# Patient Record
Sex: Male | Born: 1980 | Race: White | Hispanic: Yes | Marital: Married | Smoking: Former smoker
Health system: Southern US, Community
[De-identification: ages and names within clinical notes are randomized; demographics above are authoritative.]

## PROBLEM LIST (undated history)

## (undated) DIAGNOSIS — K5792 Diverticulitis of intestine, part unspecified, without perforation or abscess without bleeding: Secondary | ICD-10-CM

## (undated) DIAGNOSIS — T7840XA Allergy, unspecified, initial encounter: Secondary | ICD-10-CM

## (undated) HISTORY — DX: Allergy, unspecified, initial encounter: T78.40XA

---

## 2004-10-09 ENCOUNTER — Ambulatory Visit: Payer: Self-pay | Admitting: Family Medicine

## 2005-04-11 ENCOUNTER — Emergency Department (HOSPITAL_COMMUNITY): Admission: EM | Admit: 2005-04-11 | Discharge: 2005-04-12 | Payer: Self-pay | Admitting: Emergency Medicine

## 2006-04-08 ENCOUNTER — Emergency Department (HOSPITAL_COMMUNITY): Admission: EM | Admit: 2006-04-08 | Discharge: 2006-04-09 | Payer: Self-pay | Admitting: Emergency Medicine

## 2006-04-26 ENCOUNTER — Ambulatory Visit: Payer: Self-pay | Admitting: Gastroenterology

## 2006-05-01 ENCOUNTER — Ambulatory Visit: Payer: Self-pay | Admitting: Gastroenterology

## 2006-06-11 ENCOUNTER — Ambulatory Visit: Payer: Self-pay | Admitting: Gastroenterology

## 2008-05-13 ENCOUNTER — Ambulatory Visit: Payer: Self-pay | Admitting: Internal Medicine

## 2008-05-13 DIAGNOSIS — K219 Gastro-esophageal reflux disease without esophagitis: Secondary | ICD-10-CM | POA: Insufficient documentation

## 2008-05-13 DIAGNOSIS — K5900 Constipation, unspecified: Secondary | ICD-10-CM | POA: Insufficient documentation

## 2008-06-18 ENCOUNTER — Ambulatory Visit: Payer: Self-pay | Admitting: Internal Medicine

## 2008-06-18 LAB — CONVERTED CEMR LAB
ALT: 20 units/L (ref 0–53)
AST: 21 units/L (ref 0–37)
Albumin: 4.9 g/dL (ref 3.5–5.2)
BUN: 11 mg/dL (ref 6–23)
Basophils Absolute: 0 10*3/uL (ref 0.0–0.1)
Basophils Relative: 1 % (ref 0–1)
Bilirubin Urine: NEGATIVE
Blood in Urine, dipstick: NEGATIVE
Cholesterol: 158 mg/dL (ref 0–200)
Creatinine, Ser: 0.75 mg/dL (ref 0.40–1.50)
Eosinophils Relative: 1 % (ref 0–5)
Glucose, Bld: 90 mg/dL (ref 70–99)
Glucose, Urine, Semiquant: NEGATIVE
Hemoglobin: 15.6 g/dL (ref 13.0–17.0)
LDL Cholesterol: 100 mg/dL — ABNORMAL HIGH (ref 0–99)
MCHC: 33.2 g/dL (ref 30.0–36.0)
MCV: 90.9 fL (ref 78.0–100.0)
Monocytes Relative: 9 % (ref 3–12)
Neutro Abs: 3.3 10*3/uL (ref 1.7–7.7)
RBC: 5.17 M/uL (ref 4.22–5.81)
RDW: 15.2 % (ref 11.5–15.5)
Specific Gravity, Urine: 1.015
Total CHOL/HDL Ratio: 3.3
pH: 7.5

## 2008-06-22 ENCOUNTER — Encounter (INDEPENDENT_AMBULATORY_CARE_PROVIDER_SITE_OTHER): Payer: Self-pay | Admitting: Internal Medicine

## 2008-06-23 ENCOUNTER — Encounter (INDEPENDENT_AMBULATORY_CARE_PROVIDER_SITE_OTHER): Payer: Self-pay | Admitting: Internal Medicine

## 2008-06-29 ENCOUNTER — Telehealth (INDEPENDENT_AMBULATORY_CARE_PROVIDER_SITE_OTHER): Payer: Self-pay | Admitting: *Deleted

## 2008-07-07 ENCOUNTER — Telehealth (INDEPENDENT_AMBULATORY_CARE_PROVIDER_SITE_OTHER): Payer: Self-pay | Admitting: *Deleted

## 2009-07-03 ENCOUNTER — Emergency Department (HOSPITAL_COMMUNITY): Admission: EM | Admit: 2009-07-03 | Discharge: 2009-07-03 | Payer: Self-pay | Admitting: Emergency Medicine

## 2010-11-24 ENCOUNTER — Encounter (INDEPENDENT_AMBULATORY_CARE_PROVIDER_SITE_OTHER): Payer: Self-pay | Admitting: Internal Medicine

## 2010-11-24 ENCOUNTER — Ambulatory Visit: Payer: Self-pay | Admitting: Internal Medicine

## 2010-11-24 ENCOUNTER — Ambulatory Visit
Admission: RE | Admit: 2010-11-24 | Discharge: 2010-11-24 | Payer: Self-pay | Source: Home / Self Care | Attending: Internal Medicine | Admitting: Internal Medicine

## 2010-11-24 DIAGNOSIS — R5381 Other malaise: Secondary | ICD-10-CM | POA: Insufficient documentation

## 2010-11-24 DIAGNOSIS — R5383 Other fatigue: Secondary | ICD-10-CM

## 2010-11-24 LAB — CONVERTED CEMR LAB
ALT: 25 units/L (ref 0–53)
AST: 26 units/L (ref 0–37)
Basophils Absolute: 0 10*3/uL (ref 0.0–0.1)
Basophils Relative: 1 % (ref 0–1)
Blood in Urine, dipstick: NEGATIVE
CO2: 29 meq/L (ref 19–32)
Calcium: 9.7 mg/dL (ref 8.4–10.5)
Eosinophils Absolute: 0.1 10*3/uL (ref 0.0–0.7)
Eosinophils Relative: 1 % (ref 0–5)
Glucose, Bld: 94 mg/dL (ref 70–99)
Glucose, Urine, Semiquant: NEGATIVE
HCT: 44.8 % (ref 39.0–52.0)
HDL: 54 mg/dL (ref >39)
Ketones, urine, test strip: NEGATIVE
Lymphs Abs: 2 10*3/uL (ref 0.7–4.0)
Monocytes Absolute: 0.4 10*3/uL (ref 0.1–1.0)
Monocytes Relative: 8 % (ref 3–12)
Platelets: 254 10*3/uL (ref 150–400)
Potassium: 4.2 meq/L (ref 3.5–5.3)
Protein, U semiquant: NEGATIVE
RBC: 4.86 M/uL (ref 4.22–5.81)
RDW: 14.9 % (ref 11.5–15.5)
TSH: 1.341 microintl units/mL (ref 0.350–4.500)
Total Protein: 8.1 g/dL (ref 6.0–8.3)
Triglycerides: 52 mg/dL (ref <150)
WBC Urine, dipstick: NEGATIVE

## 2010-12-01 ENCOUNTER — Telehealth (INDEPENDENT_AMBULATORY_CARE_PROVIDER_SITE_OTHER): Payer: Self-pay | Admitting: Internal Medicine

## 2010-12-06 ENCOUNTER — Telehealth (INDEPENDENT_AMBULATORY_CARE_PROVIDER_SITE_OTHER): Payer: Self-pay | Admitting: Internal Medicine

## 2010-12-18 ENCOUNTER — Encounter (INDEPENDENT_AMBULATORY_CARE_PROVIDER_SITE_OTHER): Payer: Self-pay | Admitting: Internal Medicine

## 2010-12-28 NOTE — Progress Notes (Signed)
Summary: LAB RESULTS   Phone Note Call from Patient   Caller: Patient Reason for Call: Lab or Test Results Summary of Call: SPANISH PT WANTS TO KNOW HIS LAB RESULTS PLEASE CALL HIM  @ [phone]  Initial call taken by: Cheryll Dessert,  December 06, 2010 3:15 PM  Follow-up for Phone Call        Please let pt. know all labs were normal [facility] RN  December 07, 2010 11:32 AM LVM to pt to returned my call.Cheryll Dessert  December 07, 2010 2:18 PM PT CALL ME BACK I NOTIFY THAT HIS RESULTS WAS NORMAL   Follow-up by: Cheryll Dessert,  December 07, 2010 2:25 PM

## 2010-12-28 NOTE — Progress Notes (Signed)
Summary: Transfer meds to [facility]  Phone Note Refill Request   Refills Requested: Medication #1:  FAMOTIDINE 40 MG TABS 1 tab by mouth at bedtime. please to the pharmacy on [facility] on [address]  Initial call taken by: Domenic Polite,  December 01, 2010 4:30 PM  Follow-up for Phone Call        [facility] notified of pt. request.  Dutch Quint RN  December 04, 2010 12:34 PM

## 2010-12-28 NOTE — Progress Notes (Signed)
Summary: Office Visit//DEPRESSION SCREENING  Office Visit//DEPRESSION SCREENING   Imported By: Arta Bruce 11/28/2010 14:35:08  _____________________________________________________________________  External Attachment:    Type:   Image     Comment:   External Document

## 2010-12-28 NOTE — Letter (Signed)
Summary: Lipid Letter  [facility]  [address] [place], [place] [address]   Phone: [phone]  Fax: [phone]    12/18/2010  William Owens [address] [place], [place]  [address]  Dear William Owens:  We have carefully reviewed your last lipid profile from 11/24/2010 and the results are noted below with a summary of recommendations for lipid management.    Cholesterol:       152     Goal: <200   HDL "good" Cholesterol:   54     Goal: >45   LDL "bad" Cholesterol:   88     Goal: <100   Triglycerides:       52     Goal: <150    All labs and cholesterol were fine.    TLC Diet (Therapeutic Lifestyle Change): Saturated Fats & Transfatty acids should be kept < 7% of total calories ***Reduce Saturated Fats Polyunstaurated Fat can be up to 10% of total calories Monounsaturated Fat Fat can be up to 20% of total calories Total Fat should be no greater than 25-35% of total calories Carbohydrates should be 50-60% of total calories Protein should be approximately 15% of total calories Fiber should be at least 20-30 grams a day ***Increased fiber may help lower LDL Total Cholesterol should be < 200mg /day Consider adding plant stanol/sterols to diet (example: Benacol spread) ***A higher intake of unsaturated fat may reduce Triglycerides and Increase HDL    Adjunctive Measures (may lower LIPIDS and reduce risk of Heart Attack) include: Aerobic Exercise (20-30 minutes 3-4 times a week) Limit Alcohol Consumption Weight Reduction Aspirin 75-81 mg a day by mouth (if not allergic or contraindicated) Dietary Fiber 20-30 grams a day by mouth     Current Medications: 1)    Citrucel  Powd (Methylcellulose (laxative)) .... Daily use 2)    Famotidine 40 Mg Tabs (Famotidine) .Marland Kitchen.. 1 tab by mouth at bedtime  If you have any questions, please call. We appreciate being able to work with you.   Sincerely,    [facility]

## 2010-12-28 NOTE — Assessment & Plan Note (Signed)
Summary: CP//MC   Vital Signs:  Patient profile:   30 year old male Height:      67 inches Weight:      154 pounds BMI:     24.21 Temp:     97.6 degrees F oral Pulse rhythm:   regular Resp:     18 per minute BP sitting:   120 / 70  (left arm) Cuff size:   regular  Vitals Entered By: Arm[place] Shannon (November 24, 2010 8:43 AM) CC: cpe.... pt says he has an infection in his respiratory and recieved meds but now when he blows his nose its blood in it.... pt says his tongue feels like jello... pt wants cholestrol check... Is Patient Diabetic? No Pain Assessment Patient in pain? no       Does patient need assistance? Functional Status Self care Ambulation Normal   CC:  cpe.... pt says he has an infection in his respiratory and recieved meds but now when he blows his nose its blood in it.... pt says his tongue feels like jello... pt wants cholestrol check....  History of Present Illness: 30 yo male here for CPE.  Concerns:  1.  Dizziness:  Describes dehydration last summer for which he went to ED. Works outside during the summer often in [employer] that makes him hot.  Drinks water and Gatorade most times, but did not on day this occurred.  Discussed need to always drink fluids when in heat and sun.    2.  URI symptoms as above.  Went to [facility] on 11/18/10 and given Rx for Z pack--states diagnosed with sinusitis.  Also using Nyquil.  Feeling much better  3.  GERD:  Having symptoms after every meal.  Has been off medication for some time.  Current Medications (verified): 1)  None  Allergies (verified): No Known Drug Allergies  Past History:  Past Surgical History: Reviewed history from 05/13/2008 and no changes required. No surgeries   Family History: Mother, died 6: complications of cholecystitis Father, 42:  healthy 2 Brothers:  1 has DM 1 Sister:  Cholecystitis, healthy 1 Daughter:  5:  healthy 1 Son:  1 1/2 yo:  Receiving therapy for what sounds like some  physical developmental delay.  Social History: Lives at home with wife,  daughter and son. 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   Originally from [place]. in 2000 Tobacco Use:  1-2 days per week will smoke a cigar Alcohol:  Stopped secondary to problems with dizziness.  When lived alone, drank too much--about 8 years ago. Drug Use:  Never.  Review of Systems General:  Energy:  Sometimes energy is low--doesn't want to get out of bed.  Denies depression.  Does feel stressed at times. Eyes:  Denies blurring. ENT:  Denies decreased hearing. CV:  Denies chest pain or discomfort and palpitations. Resp:  Denies shortness of breath. GI:  Denies abdominal pain, bloody stools, dark tarry stools, and diarrhea; Citrucel keeps him regular. GU:  Denies discharge, dysuria, genital sores, and urinary frequency. MS:  Denies joint pain, joint redness, and joint swelling. Derm:  Denies lesion(s) and rash. Neuro:  Denies numbness, tingling, and weakness. Psych:  Denies depression and suicidal thoughts/plans; Denies anything but fatigue and some stress PHQ9 scored 2.  Physical Exam  General:  Well-developed,well-nourished,in no acute distress; alert,appropriate and cooperative throughout examination Head:  Normocephalic and atraumatic without obvious abnormalities. No apparent alopecia or balding. Eyes:  No corneal or conjunctival inflammation noted. EOMI. Perrla. Funduscopic exam benign,  without hemorrhages, exudates or papilledema. Vision grossly normal. Ears:  External ear exam shows no significant lesions or deformities.  Otoscopic examination reveals clear canals, tympanic membranes are intact bilaterally without bulging, retraction, inflammation or discharge. Hearing is grossly normal bilaterally. Nose:  External nasal examination shows no deformity or inflammation. Nasal mucosa are pink and moist without lesions or exudates. Mouth:  Oral mucosa and oropharynx without lesions  or exudates.  Teeth in good repair. Neck:  No deformities, masses, or tenderness noted. Chest Wall:  No deformities, masses, tenderness or gynecomastia noted. Lungs:  Normal respiratory effort, chest expands symmetrically. Lungs are clear to auscultation, no crackles or wheezes. Heart:  Normal rate and regular rhythm. S1 and S2 normal without gallop, murmur, click, rub or other extra sounds. Abdomen:  Bowel sounds positive,abdomen soft and non-tender without masses, organomegaly or hernias noted. Rectal:  deferred Genitalia:  Testes bilaterally descended without nodularity, tenderness or masses. No scrotal masses or lesions. No penis lesions or urethral discharge.uncircumcised.   Msk:  No deformity or scoliosis noted of thoracic or lumbar spine.   Pulses:  R and L carotid,radial,femoral,dorsalis pedis and posterior tibial pulses are full and equal bilaterally Extremities:  No clubbing, cyanosis, edema, or deformity noted with normal full range of motion of all joints.   Neurologic:  No cranial nerve deficits noted. Station and gait are normal. Plantar reflexes are down-going bilaterally. DTRs are symmetrical throughout. Sensory, motor and coordinative functions appear intact. Skin:  Intact without suspicious lesions or rashes Cervical Nodes:  No lymphadenopathy noted Axillary Nodes:  No palpable lymphadenopathy Inguinal Nodes:  No significant adenopathy Psych:  Cognition and judgment appear intact. Alert and cooperative with normal attention span and concentration. No apparent delusions, illusions, hallucinations   Impression & Recommendations:  Problem # 1:  HEALTH MAINTENANCE EXAM (ICD-V70.0) Flu vaccine  Orders: UA Dipstick w/o Micro (manual) (38756) T-Lipid Profile (43329-51884)  Problem # 2:  FATIGUE (ICD-780.79) And intermittent dizziness--sounds more related to dehydration when working. Orders: T-Comprehensive Metabolic Panel [phone]) T-CBC w/Diff ([id number]) T-TSH  [phone])  Problem # 3:  GERD (ICD-530.81) No orange card currently--cancel Protonix and go to Famotidine His updated medication list for this problem includes:    Famotidine 40 Mg Tabs (Famotidine) .Marland Kitchen... 1 tab by mouth at bedtime  Complete Medication List: 1)  Citrucel Powd (Methylcellulose (laxative)) .... Daily use 2)  Famotidine 40 Mg Tabs (Famotidine) .Marland Kitchen.. 1 tab by mouth at bedtime  Patient Instructions: 1)  Call if heartburn does not go away with use of daily Protonix. 2)  Call for physical in 1 year--Dr. Delrae Alfred Prescriptions: FAMOTIDINE 40 MG TABS (FAMOTIDINE) 1 tab by mouth at bedtime  #30 x 11   Entered and Authorized by:   Julieanne Manson MD   Signed by:   Julieanne Manson MD on 11/24/2010   Method used:   Electronically to        [facility]  [phone]* (retail)       [address]       [place], [place]  [address]       Ph: [phone] or [phone]       Fax: [phone]   RxID:   [id number] PROTONIX 40 MG TBEC (PANTOPRAZOLE SODIUM) 1 tab by mouth 1/ 2 hour before 1st meal of day on empty stomach  #30 x 11   Entered and Authorized by:   Julieanne Manson MD   Signed by:   Julieanne Manson MD on  11/24/2010   Method used:   Faxed to ...       [facility] (retail)       [address] [name], [place]  [address]       Ph: [phone]       Fax: [phone]   RxID:   [id number]    Orders Added: 1)  Est. Patient age 13-39 [9] 2)  UA Dipstick w/o Micro (manual) [81002] 3)  T-Lipid Profile [[id number]] 4)  T-Comprehensive Metabolic Panel [[id number]] 5)  T-CBC w/Diff [84132-44010] 6)  T-TSH [[id number]]   Immunization History:  Tetanus/Td Immunization History:    Tetanus/Td:  historical (11/26/2005)   Immunization History:  Tetanus/Td Immunization History:    Tetanus/Td:  Historical (11/26/2005)   Preventive Care Screening     STE:   Yes--no abnormalities  Laboratory Results   Urine Tests    Routine Urinalysis   Glucose: negative   (Normal Range: Negative) Bilirubin: negative   (Normal Range: Negative) Ketone: negative   (Normal Range: Negative) Spec. Gravity: >=1.030   (Normal Range: 1.003-1.035) Blood: negative   (Normal Range: Negative) pH: 6.0   (Normal Range: 5.0-8.0) Protein: negative   (Normal Range: Negative) Urobilinogen: 0.2   (Normal Range: 0-1) Nitrite: negative   (Normal Range: Negative) Leukocyte Esterace: negative   (Normal Range: Negative)        Appended Document: Flu Shot    Nurse Visit   Allergies: No Known Drug Allergies  Immunizations Administered:  Influenza Vaccine # 1:    Vaccine Type: Fluvax 3+    Site: left deltoid    Mfr: GlaxoSmithKline    Dose: 0.5 ml    Route: IM    Given by: Hale Drone CMA    Exp. Date: 05/26/2011    Lot #: [id number]    VIS given: 06/20/10 version given November 24, 2010.  Flu Vaccine Consent Questions:    Do you have a history of severe allergic reactions to this vaccine? no    Any prior history of allergic reactions to egg and/or gelatin? no    Do you have a sensitivity to the preservative Thimersol? no    Do you have a past history of Guillan-Barre Syndrome? no    Do you currently have an acute febrile illness? no    Have you ever had a severe reaction to latex? no    Vaccine information given and explained to patient? yes  Orders Added: 1)  Flu Vaccine 34yrs + [90658] 2)  Admin 1st Vaccine [87564]

## 2011-01-02 ENCOUNTER — Emergency Department (HOSPITAL_COMMUNITY)
Admission: EM | Admit: 2011-01-02 | Discharge: 2011-01-03 | Disposition: A | Discharge status: Home / Self Care | Payer: Managed Care, Other (non HMO) | Attending: Emergency Medicine | Admitting: Emergency Medicine

## 2011-01-02 ENCOUNTER — Emergency Department (HOSPITAL_COMMUNITY): Payer: Managed Care, Other (non HMO)

## 2011-01-02 DIAGNOSIS — R0789 Other chest pain: Secondary | ICD-10-CM | POA: Insufficient documentation

## 2011-01-02 DIAGNOSIS — J029 Acute pharyngitis, unspecified: Secondary | ICD-10-CM | POA: Insufficient documentation

## 2011-01-02 DIAGNOSIS — M25519 Pain in unspecified shoulder: Secondary | ICD-10-CM | POA: Insufficient documentation

## 2011-01-02 DIAGNOSIS — R059 Cough, unspecified: Secondary | ICD-10-CM | POA: Insufficient documentation

## 2011-01-02 DIAGNOSIS — R05 Cough: Secondary | ICD-10-CM | POA: Insufficient documentation

## 2011-01-03 LAB — BASIC METABOLIC PANEL
BUN: 12 mg/dL (ref 6–23)
CO2: 25 mEq/L (ref 19–32)
Calcium: 9.1 mg/dL (ref 8.4–10.5)
Chloride: 106 mEq/L (ref 96–112)
Creatinine, Ser: 0.76 mg/dL (ref 0.4–1.5)
GFR calc non Af Amer: >60 mL/min (ref >60)
Glucose, Bld: 105 mg/dL — ABNORMAL HIGH (ref 70–99)
Sodium: 137 mEq/L (ref 135–145)

## 2011-01-03 LAB — DIFFERENTIAL
Basophils Absolute: 0 10*3/uL (ref 0.0–0.1)
Eosinophils Absolute: 0.3 10*3/uL (ref 0.0–0.7)
Eosinophils Relative: 3 % (ref 0–5)
Lymphocytes Relative: 46 % (ref 12–46)
Monocytes Absolute: 0.7 10*3/uL (ref 0.1–1.0)
Monocytes Relative: 9 % (ref 3–12)

## 2011-01-03 LAB — CBC
HCT: 42.1 % (ref 39.0–52.0)
MCV: 87.3 fL (ref 78.0–100.0)
Platelets: 240 10*3/uL (ref 150–400)

## 2011-03-03 LAB — DIFFERENTIAL
Lymphs Abs: 2.3 10*3/uL (ref 0.7–4.0)
Monocytes Absolute: 0.7 10*3/uL (ref 0.1–1.0)
Neutrophils Relative %: 53 % (ref 43–77)

## 2011-03-03 LAB — COMPREHENSIVE METABOLIC PANEL
ALT: 18 U/L (ref 0–53)
AST: 25 U/L (ref 0–37)
GFR calc Af Amer: >60 mL/min (ref >60)
GFR calc non Af Amer: >60 mL/min (ref >60)
Glucose, Bld: 86 mg/dL (ref 70–99)
Sodium: 138 mEq/L (ref 135–145)
Total Bilirubin: 0.8 mg/dL (ref 0.3–1.2)

## 2011-03-03 LAB — CBC
HCT: 43.2 % (ref 39.0–52.0)
Hemoglobin: 14.7 g/dL (ref 13.0–17.0)
MCHC: 34 g/dL (ref 30.0–36.0)
MCV: 93.9 fL (ref 78.0–100.0)

## 2011-03-03 LAB — URINALYSIS, ROUTINE W REFLEX MICROSCOPIC
Specific Gravity, Urine: 1.004 — ABNORMAL LOW (ref 1.005–1.030)
Urobilinogen, UA: 0.2 mg/dL (ref 0.0–1.0)
pH: 6.5 (ref 5.0–8.0)

## 2011-03-17 ENCOUNTER — Emergency Department (HOSPITAL_COMMUNITY)
Admission: EM | Admit: 2011-03-17 | Discharge: 2011-03-17 | Disposition: A | Discharge status: Home / Self Care | Payer: BC Managed Care – PPO | Attending: Family Medicine | Admitting: Family Medicine

## 2011-03-17 DIAGNOSIS — R209 Unspecified disturbances of skin sensation: Secondary | ICD-10-CM | POA: Insufficient documentation

## 2011-03-17 DIAGNOSIS — R079 Chest pain, unspecified: Secondary | ICD-10-CM | POA: Insufficient documentation

## 2011-03-17 DIAGNOSIS — F17200 Nicotine dependence, unspecified, uncomplicated: Secondary | ICD-10-CM | POA: Insufficient documentation

## 2011-04-13 NOTE — Assessment & Plan Note (Signed)
[facility]                           GASTROENTEROLOGY OFFICE NOTE   William Owens, William Owens                   MRN:          [id number]  DATE:06/11/2006                            DOB:          [birth year]    GI PROBLEM LIST:  Diverticulitis spring 2007.  Normal CBC, normal metabolic  profile.  CT scan at [facility] ER suggested diverticulosis with mild  inflammatory change in the proximal sigmoid colon.  Amoxicillin, Flagyl for  10 days and pain improved.  Status post colonoscopy June 2007.  Normal.  No  overt diverticulosis seen.   INTERVAL HISTORY:  The last time I saw William Owens was in early June at the  time of his colonoscopy.  I did not see the diverticulosis that was  described on CT scan but I do believe that he did have diverticulosis as  seen on the CT scan.  He did also have some mild inflammatory changes on the  CT scan so he probably indeed had diverticulitis.  There is no sign of any  underlying malignancy or continued inflammatory process on the colonoscopy.  He began taking fiber supplementation after the colonoscopy and since then  his bowels have regulated very well. He is having one soft easy to move  bowel movement on a daily basis.  No more straining.  He does note if he  misses a day or two of the Metamucil, that he does have the strain.   CURRENT MEDICATIONS:  Citrucel daily.   PHYSICAL EXAMINATION:  VITAL SIGNS: Weight 166 pounds.  Blood pressure  124/70, pulse 60.  CONSTITUTIONAL:  Generally well-appearing.  LUNGS:  Clear to auscultation bilaterally.  HEART:  Regular rate and rhythm.  ABDOMEN:  Soft, nontender, nondistended.  Normal bowel sounds.   ASSESSMENT/PLAN:  A 30 year old man with history of diverticulitis.  He has  had one complication from his diverticulosis and so I see no reason to refer  him for surgical intervention at this point.  He will get in touch with me  if he has any further problems and he knows  to stay on fiber supplementation  on a daily basis.  This will help prevent straining and possibly may prevent  further diverticular complications although the data on that are not too  great.                                   Rachael Fee, MD   DPJ/MedQ  DD:  06/11/2006  DT:  06/11/2006  Job #:  [id number]

## 2012-02-09 ENCOUNTER — Other Ambulatory Visit: Payer: Self-pay

## 2012-02-09 ENCOUNTER — Encounter (HOSPITAL_COMMUNITY): Payer: Self-pay | Admitting: Nurse Practitioner

## 2012-02-09 ENCOUNTER — Emergency Department (HOSPITAL_COMMUNITY)
Admission: EM | Admit: 2012-02-09 | Discharge: 2012-02-10 | Disposition: A | Discharge status: Home / Self Care | Payer: Self-pay | Attending: Emergency Medicine | Admitting: Emergency Medicine

## 2012-02-09 DIAGNOSIS — N289 Disorder of kidney and ureter, unspecified: Secondary | ICD-10-CM | POA: Insufficient documentation

## 2012-02-09 DIAGNOSIS — R109 Unspecified abdominal pain: Secondary | ICD-10-CM | POA: Insufficient documentation

## 2012-02-09 DIAGNOSIS — R209 Unspecified disturbances of skin sensation: Secondary | ICD-10-CM | POA: Insufficient documentation

## 2012-02-09 DIAGNOSIS — R079 Chest pain, unspecified: Secondary | ICD-10-CM | POA: Insufficient documentation

## 2012-02-09 HISTORY — DX: Diverticulitis of intestine, part unspecified, without perforation or abscess without bleeding: K57.92

## 2012-02-09 LAB — BASIC METABOLIC PANEL
BUN: 20 mg/dL (ref 6–23)
GFR calc non Af Amer: 68 mL/min — ABNORMAL LOW (ref >90)
Potassium: 3.7 mEq/L (ref 3.5–5.1)
Sodium: 142 mEq/L (ref 135–145)

## 2012-02-09 LAB — CBC
Hemoglobin: 14.9 g/dL (ref 13.0–17.0)
MCH: 31.1 pg (ref 26.0–34.0)
MCV: 88.3 fL (ref 78.0–100.0)
RDW: 14.1 % (ref 11.5–15.5)

## 2012-02-09 LAB — POCT I-STAT TROPONIN I

## 2012-02-09 NOTE — ED Notes (Addendum)
Pt reports he was at rest approx 10 minutes pta when L arm felt numb and onset CP, palpitations. Symptoms intermittent since onset. A&OX4. Denies cardiac history

## 2012-02-09 NOTE — ED Notes (Signed)
Patient complaining of chest pain; describes location of chest pain as "left-sided"; pain radiates to left arm. Patient reports associated symptoms: diaphoresis and nausea. Denies shortness of breath, vomiting,and blurred vision. Patient also reports lower left quadrant abdominal pain.  Patient denies cardiac history. Patient alert and oriented x4; PERRL present. Family present at bedside. Patient speak spanish; requests interpreter. Interpreter phones at bedside. Upon arrival to room, patient changed into gown and connected to continuous cardiac, pulse ox, and blood pressure monitor. Will continue to monitor.

## 2012-02-10 ENCOUNTER — Emergency Department (HOSPITAL_COMMUNITY): Payer: Self-pay

## 2012-02-10 MED ORDER — KETOROLAC TROMETHAMINE 30 MG/ML IJ SOLN
30.0000 mg | Freq: Once | INTRAMUSCULAR | Status: AC
  Administered 2012-02-10: 30 mg via INTRAVENOUS
  Filled 2012-02-10: qty 1

## 2012-02-10 MED ORDER — ONDANSETRON HCL 4 MG/2ML IJ SOLN
4.0000 mg | Freq: Once | INTRAMUSCULAR | Status: AC
  Administered 2012-02-10: 4 mg via INTRAVENOUS
  Filled 2012-02-10: qty 2

## 2012-02-10 NOTE — ED Provider Notes (Signed)
History     CSN: [id number]  Arrival date & time 02/09/12  4098   First MD Initiated Contact with Patient 02/09/12 2350      Chief Complaint  Patient presents with  . Chest Pain    Patient is a 31 y.o. male presenting with chest pain. The history is provided by the patient. A language interpreter was used.  Chest Pain The chest pain began 6 - 12 hours ago. Chest pain occurs constantly. The chest pain is unchanged. Associated with: abdominal pain. The severity of the pain is moderate. Quality: "gas pain" The pain does not radiate. Exacerbated by: nothing. Primary symptoms include abdominal pain. Pertinent negatives for primary symptoms include no fever, no syncope, no shortness of breath, no nausea, no vomiting and no dizziness.  Associated symptoms include numbness. He tried nothing for the symptoms. Risk factors include no known risk factors.   pt reports abdominal pain, chest pain and reports numbness in left UE Reports pain feels like "gas pain" in his abdomen and seems to move into chest.  He also reports numbness in left UE but does not seem to radiate from chest.  No focal weakness.  No h/o CAD.  No h/o DVT/PE.     Past Medical History  Diagnosis Date  . Diverticulitis     History reviewed. No pertinent past surgical history.  History reviewed. No pertinent family history.  History  Substance Use Topics  . Smoking status: Former Games developer  . Smokeless tobacco: Not on file  . Alcohol Use: 1.2 oz/week    2 Cans of beer per week     social      Review of Systems  Constitutional: Negative for fever.  Respiratory: Negative for shortness of breath.   Cardiovascular: Positive for chest pain. Negative for syncope.  Gastrointestinal: Positive for abdominal pain. Negative for nausea and vomiting.  Neurological: Positive for numbness. Negative for dizziness.  All other systems reviewed and are negative.    Allergies  Review of patient's allergies indicates no known  allergies.  Home Medications  No current outpatient prescriptions on file.  BP 120/67  Pulse 71  Temp(Src) 98.7 F (37.1 C) (Oral)  Resp 14  SpO2 100%  Physical Exam CONSTITUTIONAL: Well developed/well nourished HEAD AND FACE: Normocephalic/atraumatic EYES: EOMI/PERRL ENMT: Mucous membranes moist NECK: supple no meningeal signs SPINE:entire spine nontender CV: S1/S2 noted, no murmurs/rubs/gallops noted LUNGS: Lungs are clear to auscultation bilaterally, no apparent distress ABDOMEN: soft, nontender, no rebound or guarding GU:no cva tenderness NEURO: Pt is awake/alert, moves all extremitiesx4.  No focal sensory deficits.  Equal hand grips, no focal motor deficits noted to his extremities EXTREMITIES: pulses normal, full ROM, no edema noted SKIN: warm, color normal PSYCH: no abnormalities of mood noted  ED Course  Procedures   Labs Reviewed  BASIC METABOLIC PANEL - Abnormal; Notable for the following:    Glucose, Bld 104 (*)    Creatinine, Ser 1.37 (*)    GFR calc non Af Amer 68 (*)    GFR calc Af Amer 78 (*)    All other components within normal limits  CBC  POCT I-STAT TROPONIN I   12:38 AM Pt well appearing Reports abd pain and chest pain.  His exam is unremarkable.  He is low risk for PE/ACS.  His abd exam is benign.  I doubt acute abd process.  His EKG is nondiagnostic.   Advised f/u as outpatient for renal insufficiency   The patient appears reasonably screened and/or stabilized  for discharge and I doubt any other medical condition or other [facility] requiring further screening, evaluation, or treatment in the ED at this time prior to discharge.   MDM  Nursing notes reviewed and considered in documentation All labs/vitals reviewed and considered xrays reviewed and considered        Date: 02/10/2012  Rate: 74  Rhythm: normal sinus rhythm  QRS Axis: normal  Intervals: normal  ST/T Wave abnormalities: nonspecific ST changes  Conduction Disutrbances:none   Narrative Interpretation:   Old EKG Reviewed: unchanged    Joya Gaskins, MD 02/10/12 [phone]

## 2012-02-10 NOTE — ED Notes (Signed)
Patient currently sitting up in bed; no respiratory or acute distress noted.  Patient updated on plan of care; informed patient that we are currently waiting on x-ray results; patient has no other questions or concerns at this time; will continue to monitor.

## 2012-02-10 NOTE — ED Notes (Signed)
Patient currently sitting up in bed; no respiratory or acute distress noted.  Dr. Bebe Shaggy currently at bedside with interpreter phones; will continue to monitor.

## 2012-02-10 NOTE — ED Notes (Signed)
Patient given copy of discharge paperwork; went over discharge instructions with patient using interpreter phones -- instructed patient to follow up with his primary care physician within one month to have his kidney function and other labs rechecked; instructed to stay hydrated and drink plenty of water.  Patient instructed to return to the ED if he has continuing chest pain within the next 24 hours and that he can return to the ED for new, worsening, or concerning symptoms.

## 2012-02-10 NOTE — Discharge Instructions (Signed)
YOU SHOULD HAVE RECHECK OF YOUR LABS WITHIN ONE MONTH PLEASE STAY HYDRATED RETURN FOR ANY WORSENED CHEST PAIN OVER THE NEXT 24 HOURS  Abdominal (belly) pain can be caused by many things. any cases can be observed and treated at home after initial evaluation in the emergency department. Even though you are being discharged home, abdominal pain can be unpredictable. Therefore, you need a repeated exam if your pain does not resolve, returns, or worsens. Most patients with abdominal pain don't have to be admitted to the hospital or have surgery, but serious problems like appendicitis and gallbladder attacks can start out as nonspecific pain. Many abdominal conditions cannot be diagnosed in one visit, so follow-up evaluations are very important. SEEK IMMEDIATE MEDICAL ATTENTION IF: The pain does not go away or becomes severe, particularly over the next 8-12 hours.  A temperature above 100.68F develops.  Repeated vomiting occurs (multiple episodes).  The pain becomes localized to portions of the abdomen. The right side could possibly be appendicitis. In an adult, the left lower portion of the abdomen could be colitis or diverticulitis.  Blood is being passed in stools or vomit (bright red or black tarry stools).  Return also if you develop chest pain, difficulty breathing, dizziness or fainting, or become confused, poorly responsive, or inconsolable (young children).  Your caregiver has diagnosed you as having chest pain that is not specific for one problem, but does not require admission.  Chest pain comes from many different causes.  SEEK IMMEDIATE MEDICAL ATTENTION IF: You have severe chest pain, especially if the pain is crushing or pressure-like and spreads to the arms, back, neck, or jaw, or if you have sweating, nausea (feeling sick to your stomach), or shortness of breath. THIS IS AN EMERGENCY. Don't wait to see if the pain will go away. Get medical help at once. Call 911 or 0 (operator). DO NOT  drive yourself to the hospital.  Your chest pain gets worse and does not go away with rest.  You have an attack of chest pain lasting longer than usual, despite rest and treatment with the medications your caregiver has prescribed.  You wake from sleep with chest pain or shortness of breath.  You feel dizzy or faint.  You have chest pain not typical of your usual pain for which you originally saw your caregiver.

## 2012-02-10 NOTE — ED Notes (Signed)
Dr. Wickline at bedside.  

## 2012-07-29 ENCOUNTER — Emergency Department (HOSPITAL_COMMUNITY)
Admission: EM | Admit: 2012-07-29 | Discharge: 2012-07-30 | Disposition: A | Discharge status: Home / Self Care | Payer: Self-pay | Attending: Emergency Medicine | Admitting: Emergency Medicine

## 2012-07-29 ENCOUNTER — Encounter (HOSPITAL_COMMUNITY): Payer: Self-pay | Admitting: Family Medicine

## 2012-07-29 DIAGNOSIS — R110 Nausea: Secondary | ICD-10-CM

## 2012-07-29 DIAGNOSIS — E860 Dehydration: Secondary | ICD-10-CM

## 2012-07-29 LAB — COMPREHENSIVE METABOLIC PANEL
ALT: 18 U/L (ref 0–53)
AST: 29 U/L (ref 0–37)
Alkaline Phosphatase: 94 U/L (ref 39–117)
GFR calc Af Amer: >90 mL/min (ref >90)
Glucose, Bld: 101 mg/dL — ABNORMAL HIGH (ref 70–99)
Potassium: 3.5 mEq/L (ref 3.5–5.1)
Sodium: 141 mEq/L (ref 135–145)
Total Protein: 7.8 g/dL (ref 6.0–8.3)

## 2012-07-29 LAB — CBC WITH DIFFERENTIAL/PLATELET
Basophils Absolute: 0 10*3/uL (ref 0.0–0.1)
Eosinophils Absolute: 0.1 10*3/uL (ref 0.0–0.7)
Lymphocytes Relative: 34 % (ref 12–46)
Lymphs Abs: 2.8 10*3/uL (ref 0.7–4.0)
MCH: 31 pg (ref 26.0–34.0)
Neutrophils Relative %: 54 % (ref 43–77)
Platelets: 214 10*3/uL (ref 150–400)
RBC: 5.19 MIL/uL (ref 4.22–5.81)
WBC: 8.1 10*3/uL (ref 4.0–10.5)

## 2012-07-29 MED ORDER — HYDROCODONE-ACETAMINOPHEN 5-325 MG PO TABS
1.0000 | ORAL_TABLET | Freq: Once | ORAL | Status: AC
  Administered 2012-07-29: 1 via ORAL
  Filled 2012-07-29: qty 1

## 2012-07-29 MED ORDER — ONDANSETRON HCL 4 MG/2ML IJ SOLN
4.0000 mg | Freq: Once | INTRAMUSCULAR | Status: AC
  Administered 2012-07-29: 4 mg via INTRAVENOUS
  Filled 2012-07-29: qty 2

## 2012-07-29 MED ORDER — SODIUM CHLORIDE 0.9 % IV BOLUS (SEPSIS)
1000.0000 mL | Freq: Once | INTRAVENOUS | Status: AC
  Administered 2012-07-29: 1000 mL via INTRAVENOUS

## 2012-07-29 NOTE — ED Notes (Signed)
Per interpreter: Pt has been having dizziness at work with nausea starting today.  Reports being treated last week for infection in throat, doesn't know if this is related. Denies pain. Feels like "stomach turns from one side to the other." Denies urinary problems. Denies diarrhea. Reports headaches, some blurred vision and dizziness.

## 2012-07-30 MED ORDER — ONDANSETRON HCL 4 MG PO TABS: 4.0000 mg | ORAL_TABLET | Freq: Four times a day (QID) | ORAL | Status: AC

## 2012-07-30 NOTE — ED Provider Notes (Signed)
Medical screening examination/treatment/procedure(s) were performed by non-physician practitioner and as supervising physician I was immediately available for consultation/collaboration.  Sunnie Nielsen, MD 07/30/12 [phone]

## 2012-07-30 NOTE — ED Provider Notes (Signed)
History     CSN: [id number]  Arrival date & time 07/29/12  2045   First MD Initiated Contact with Patient 07/29/12 2210      Chief Complaint  Patient presents with  . Dizziness  . Nausea  . Emesis    (Consider location/radiation/quality/duration/timing/severity/associated sxs/prior treatment) HPI  Patient presents to the emergency department with complaints of nausea starting a couple of hours ago. He states that he was at work, hot hot sun today it did not get a chance to drink a lot of water. He states that at the end of work he did get a little bit dizzy but he drank some water and the dizziness resolved. He denies having any episodes of vomiting or any symptoms of abdominal pain. He denies having any difficulty with balance or with walking. He states that he does not know why he is nauseous and it concerned him. The patient's vital signs are stable and he is in no acute distress.  Past Medical History  Diagnosis Date  . Diverticulitis     History reviewed. No pertinent past surgical history.  History reviewed. No pertinent family history.  History  Substance Use Topics  . Smoking status: Former Games developer  . Smokeless tobacco: Not on file  . Alcohol Use: 1.2 oz/week    2 Cans of beer per week     social      Review of Systems   Review of Systems  Gen: no weight loss, fevers, chills, night sweats  Eyes: no discharge or drainage, no occular pain or visual changes  Nose: no epistaxis or rhinorrhea  Mouth: no dental pain, no sore throat  Neck: no neck pain  Lungs:No wheezing, coughing or hemoptysis CV: no chest pain, palpitations, dependent edema or orthopnea  Abd: no abdominal pain, + nausea, - vomiting  GU: no dysuria or gross hematuria  MSK:  No abnormalities  Neuro: no headache, no focal neurologic deficits  Skin: no abnormalities Psyche: negative.    Allergies  Review of patient's allergies indicates no known allergies.  Home Medications   Current  Outpatient Rx  Name Route Sig Dispense Refill  . PSYLLIUM 95 % PO PACK Oral Take 1 packet by mouth daily.    Marland Kitchen ONDANSETRON HCL 4 MG PO TABS Oral Take 1 tablet (4 mg total) by mouth every 6 (six) hours. 12 tablet 0    BP 132/76  Pulse 76  Temp 98.2 F (36.8 C) (Oral)  Resp 18  SpO2 100%  Physical Exam  Nursing note and vitals reviewed. Constitutional: He appears well-developed and well-nourished. No distress.  HENT:  Head: Normocephalic and atraumatic.  Eyes: Pupils are equal, round, and reactive to light.  Neck: Normal range of motion. Neck supple.  Cardiovascular: Normal rate and regular rhythm.   Pulmonary/Chest: Effort normal.  Abdominal: Soft. Bowel sounds are normal. He exhibits no distension. There is no tenderness. There is no rigidity, no rebound, no guarding and negative Murphy's sign.  Neurological: He is alert.  Skin: Skin is warm and dry.    ED Course  Procedures (including critical care time)  Labs Reviewed  COMPREHENSIVE METABOLIC PANEL - Abnormal; Notable for the following:    Glucose, Bld 101 (*)     Total Bilirubin 0.2 (*)     All other components within normal limits  CBC WITH DIFFERENTIAL   No results found.   1. Dehydration   2. Nausea       MDM  Patient was given a liter of  fluid and some Zofran in the emergency department. I reevaluated him after 30 minutes and he states that he felt completely better and would like to home. I've advised him to return to the ER if he needs to and followup with his primary care doctor.  Pt has been advised of the symptoms that warrant their return to the ED. Patient has voiced understanding and has agreed to follow-up with the PCP or specialist.         Dorthula Matas, PA 07/30/12 [phone]

## 2012-08-21 IMAGING — CR DG CHEST 2V
2 series · 2 of 2 positions shown · non-contrast
Comparison: Chest radiograph of 01/02/2011

CLINICAL DATA: Chest pain and left arm pain.  Left lower quadrant
abdominal pain.

CHEST - 2 VIEW

[w chest pa]
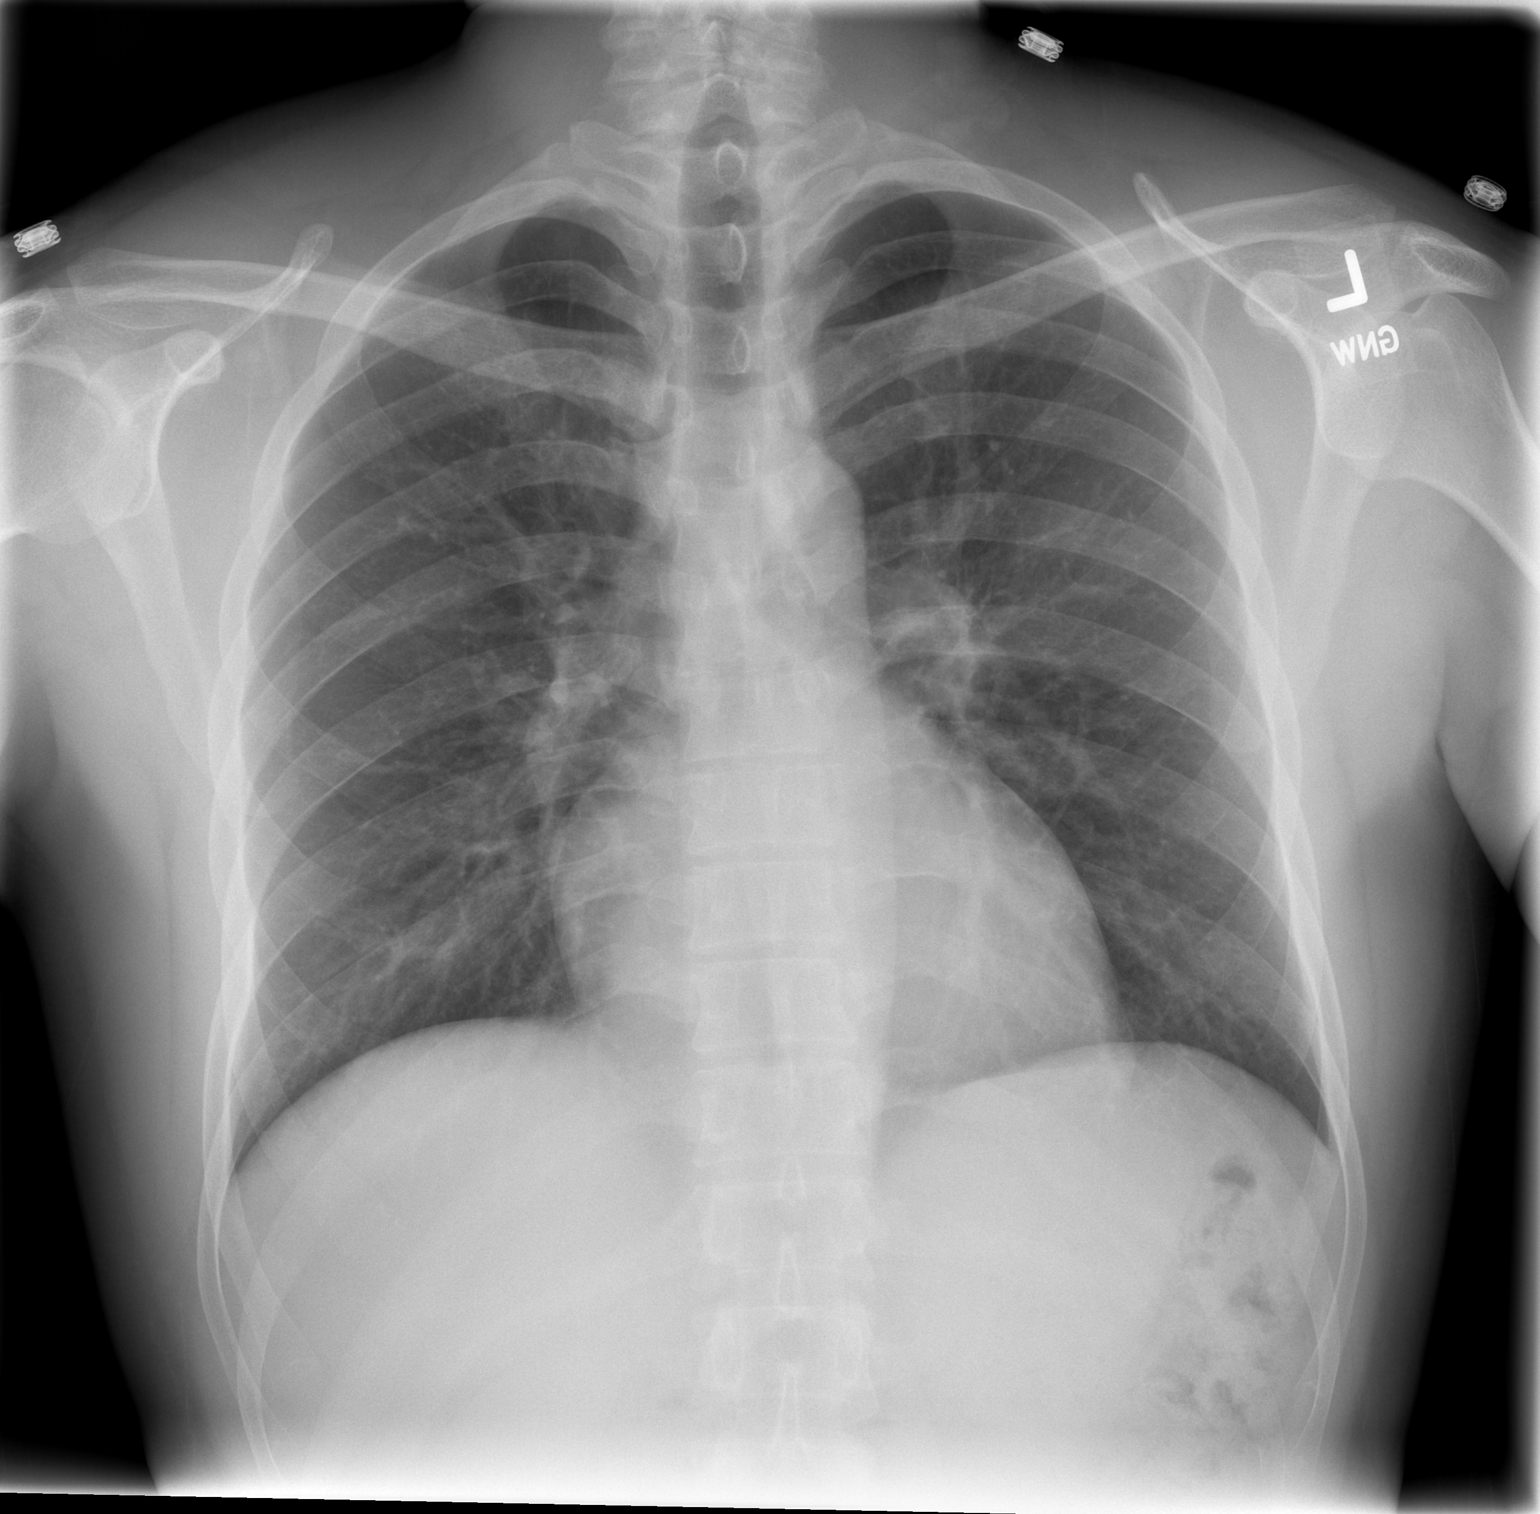

[w chest lat]
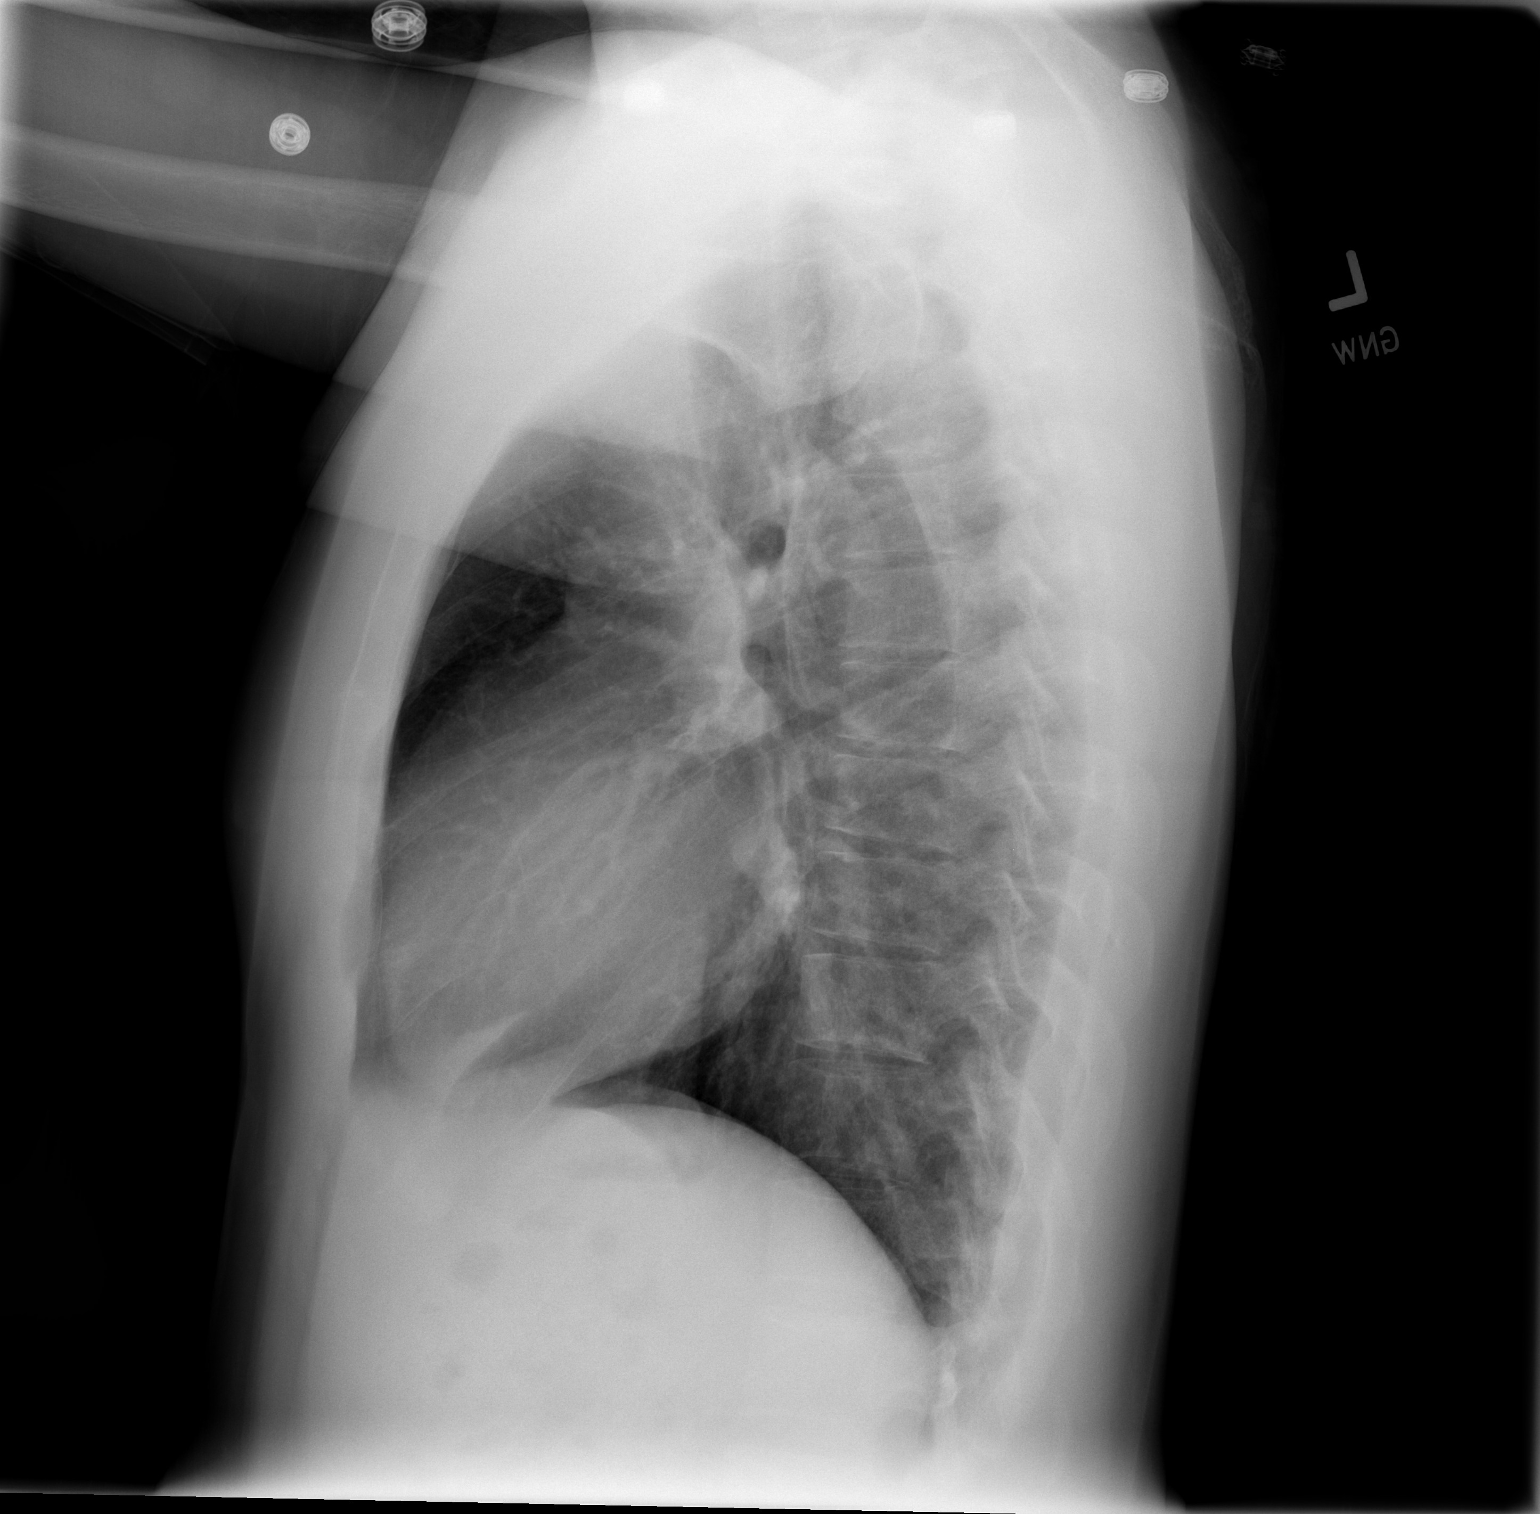

[2 of 2 positions shown; findings below may reference images not displayed]

FINDINGS: The lungs are well-aerated and clear.  There is no
evidence of focal opacification, pleural effusion or pneumothorax.

The heart is normal in size; the mediastinal contour is within
normal limits.  No acute osseous abnormalities are seen.
IMPRESSION: No acute cardiopulmonary process seen.

## 2013-01-21 ENCOUNTER — Ambulatory Visit (INDEPENDENT_AMBULATORY_CARE_PROVIDER_SITE_OTHER): Payer: BC Managed Care – PPO | Admitting: Emergency Medicine

## 2013-01-21 VITALS — BP 110/74 | HR 65 | Temp 98.1°F | Resp 16 | Ht 67.0 in | Wt 157.2 lb

## 2013-01-21 DIAGNOSIS — J018 Other acute sinusitis: Secondary | ICD-10-CM

## 2013-01-21 DIAGNOSIS — H66009 Acute suppurative otitis media without spontaneous rupture of ear drum, unspecified ear: Secondary | ICD-10-CM

## 2013-01-21 MED ORDER — AMOXICILLIN-POT CLAVULANATE 875-125 MG PO TABS: 1.0000 | ORAL_TABLET | Freq: Two times a day (BID) | ORAL | Status: DC

## 2013-01-21 MED ORDER — PSEUDOEPHEDRINE-GUAIFENESIN ER 60-600 MG PO TB12: 1.0000 | ORAL_TABLET | Freq: Two times a day (BID) | ORAL | Status: DC

## 2013-01-21 NOTE — Patient Instructions (Addendum)
Otitis media en el adulto  (Otitis Media, Adult)  Usted tiene una infección en el oído medio. Este tipo de infección afecta el espacio que se encuentra detrás del tímpano. Este trastorno se denomina "otitis media" Puede ocurrir como consecuencia de un resfrío común. Lo origina un germen que comienza a multiplicarse en ese espacio. Usted podrá sentir que las ganglios del cuello que están del lado de la infección se hinchan.  INSTRUCCIONES PARA EL CUIDADO DOMICILIARIO  · Tome los medicamentos tal como se le ha indicado hasta que se terminen, aun si se siente bien durante los primeros días.  · Utilice los medicamentos de venta libre o de prescripción para el dolor, el malestar o la fiebre, según se lo indique el profesional que lo asiste.  · Ocasionalmente, puede utilizar un descongestivo nasal, un par de veces por día para mejorar las molestias y ayudar a que las trompas de [place] drenen mejor.  Concurra a este centro para un seguimiento con el profesional que lo asiste luego de 10 a 14 días, o cuando se le indique, para asegurarse que la infección ha desaparecido completamente.  SOLICITE ATENCIÓN MÉDICA DE INMEDIATO SI:  · No obtiene mejoría en 2 ó 3 días.  · El dolor no se alivia con los medicamentos.  · Siente que empeora en vez de mejorar.  · Si no puede tomar los medicamentos según se le ha indicado.  · Presenta hinchazón, enrojecimiento o dolor alrededor del oído o rigidez en el cuello.  ESTÉ SEGURO QUE:  · Comprende las instrucciones para el alta médica.  · Controlará su enfermedad.  · Solicitará atención médica de inmediato según las indicaciones.  Document Released: 08/22/2005 Document Revised: 02/04/2012  ExitCare® Patient Information ©2013 [employer].

## 2013-01-21 NOTE — Progress Notes (Signed)
[facility] [address], [place] [address] [id number]  Date:  01/21/2013   Name:  William Owens   DOB:  [birth year]   MRN:  [id number]  PCP:  Provider Not In System    Chief Complaint: Sore Throat and Otalgia   History of Present Illness:  William Owens is a 32 y.o. very pleasant male patient who presents with the following:  Ill for four days with "the flu".  Says that he has had a sore throat and pain in ears.  No cough, nasal congestion or drainage.  No wheezing or shortness of breath.  No fever or chills.  No nausea or vomiting.  No improvement with OTC.  Patient Active Problem List  Diagnosis  . GERD  . CONSTIPATION  . FATIGUE    Past Medical History  Diagnosis Date  . Diverticulitis     History reviewed. No pertinent past surgical history.  History  Substance Use Topics  . Smoking status: Former Games developer  . Smokeless tobacco: Not on file  . Alcohol Use: 1.2 oz/week    2 Cans of beer per week     Comment: social    History reviewed. No pertinent family history.  No Known Allergies  Medication list has been reviewed and updated.  Current Outpatient Prescriptions on File Prior to Visit  Medication Sig Dispense Refill  . psyllium (HYDROCIL/METAMUCIL) 95 % PACK Take 1 packet by mouth daily.       No current facility-administered medications on file prior to visit.    Review of Systems:  As per HPI, otherwise negative.    Physical Examination: Filed Vitals:   01/21/13 2051  BP: 110/74  Pulse: 65  Temp: 98.1 F (36.7 C)  Resp: 16   Filed Vitals:   01/21/13 2051  Height: 5\' 7"  (1.702 m)  Weight: 157 lb 3.2 oz (71.305 kg)   Body mass index is 24.62 kg/(m^2). Ideal Body Weight: Weight in (lb) to have BMI = 25: 159.3  GEN: WDWN, NAD, Non-toxic, A & O x 3 HEENT: Atraumatic, Normocephalic. Neck supple. No masses, No LAD. Ears and Nose: No external deformity.  Right AOM.  Purulent posterior pharyngeal  drainage. CV: RRR, No M/G/R. No JVD. No thrill. No extra heart sounds. PULM: CTA B, no wheezes, crackles, rhonchi. No retractions. No resp. distress. No accessory muscle use. ABD: S, NT, ND, +BS. No rebound. No HSM. EXTR: No c/c/e NEURO Normal gait.  PSYCH: Normally interactive. Conversant. Not depressed or anxious appearing.  Calm demeanor.    Assessment and Plan: Sinusitis Right otitis media augmentin mucinex d  Carmelina Dane, MD

## 2013-03-18 ENCOUNTER — Emergency Department (HOSPITAL_COMMUNITY)
Admission: EM | Admit: 2013-03-18 | Discharge: 2013-03-18 | Disposition: A | Discharge status: Home / Self Care | Payer: BC Managed Care – PPO | Source: Home / Self Care | Attending: Family Medicine | Admitting: Family Medicine

## 2013-03-18 DIAGNOSIS — J020 Streptococcal pharyngitis: Secondary | ICD-10-CM

## 2013-03-18 MED ORDER — PREDNISONE 20 MG PO TABS: ORAL_TABLET | ORAL | Status: DC

## 2013-03-18 MED ORDER — OMEPRAZOLE 20 MG PO CPDR: 20.0000 mg | DELAYED_RELEASE_CAPSULE | Freq: Every day | ORAL | Status: DC

## 2013-03-18 MED ORDER — CEPHALEXIN 500 MG PO CAPS: 500.0000 mg | ORAL_CAPSULE | Freq: Two times a day (BID) | ORAL | Status: DC

## 2013-03-18 MED ORDER — IBUPROFEN 600 MG PO TABS: 600.0000 mg | ORAL_TABLET | Freq: Three times a day (TID) | ORAL | Status: DC | PRN

## 2013-03-18 MED ORDER — PROMETHAZINE HCL 25 MG PO TABS: 25.0000 mg | ORAL_TABLET | Freq: Three times a day (TID) | ORAL | Status: DC | PRN

## 2013-03-18 NOTE — ED Notes (Signed)
Pt c/o sore throat since yesterday. Also has chills. Took Tylenol last night and Ibuprofen this morning with no relief.Pt is alert and oriented.

## 2013-03-22 NOTE — ED Provider Notes (Signed)
History     CSN: [id number]  Arrival date & time 03/18/13  1536   First MD Initiated Contact with Patient 03/18/13 1552      Chief Complaint  Patient presents with  . Sore Throat    (Consider location/radiation/quality/duration/timing/severity/associated sxs/prior treatment) HPI Comments: 32 y/o male former smoker. Here c/o sore throat for 2 days, associated with chills and headache. Denies cough or congestion. No abdominal pain, has had nausea. No vomiting or diarrhea. Was treated for similar symptoms few weeks ago with Augmentin at [facility]. Patient states he did not completed treatment due to medication side effects (gastritis). Although symptoms improved with first few dosis.    Past Medical History  Diagnosis Date  . Diverticulitis     No past surgical history on file.  No family history on file.  History  Substance Use Topics  . Smoking status: Former Games developer  . Smokeless tobacco: Not on file  . Alcohol Use: 1.2 oz/week    2 Cans of beer per week     Comment: social      Review of Systems  Constitutional: Positive for chills and appetite change.  HENT: Positive for sore throat and trouble swallowing. Negative for congestion.   Eyes: Negative for discharge.  Respiratory: Negative for cough and shortness of breath.   Gastrointestinal: Positive for nausea. Negative for vomiting, abdominal pain and diarrhea.    Allergies  Review of patient's allergies indicates no known allergies.  Home Medications   Current Outpatient Rx  Name  Route  Sig  Dispense  Refill  . cephALEXin (KEFLEX) 500 MG capsule   Oral   Take 1 capsule (500 mg total) by mouth 2 (two) times daily.   20 capsule   0   . ibuprofen (ADVIL,MOTRIN) 600 MG tablet   Oral   Take 1 tablet (600 mg total) by mouth every 8 (eight) hours as needed for pain.   30 tablet   0   . omeprazole (PRILOSEC) 20 MG capsule   Oral   Take 1 capsule (20 mg total) by mouth daily.   30 capsule   0   .  predniSONE (DELTASONE) 20 MG tablet      2 tabs by mouth 3 days   6 tablet   0   . promethazine (PHENERGAN) 25 MG tablet   Oral   Take 1 tablet (25 mg total) by mouth every 8 (eight) hours as needed for nausea.   10 tablet   0   . pseudoephedrine-guaifenesin (MUCINEX D) 60-600 MG per tablet   Oral   Take 1 tablet by mouth every 12 (twelve) hours.   18 tablet   0   . psyllium (HYDROCIL/METAMUCIL) 95 % PACK   Oral   Take 1 packet by mouth daily.           BP 110/58  Pulse 62  Temp(Src) 97.8 F (36.6 C) (Oral)  Resp 16  SpO2 100%  Physical Exam  Nursing note and vitals reviewed. Constitutional: He is oriented to person, place, and time. He appears well-developed and well-nourished. No distress.  HENT:  Head: Normocephalic and atraumatic.  Nasal Congestion with erythema and swelling of nasal turbinates, clear rhinorrhea. Significant pharyngeal and tonsillar erythema with white exudates. No uvula deviation. No trismus. TM's with increased vascular markings and some dullness bilaterally no swelling or bulging  Eyes: Conjunctivae are normal. No scleral icterus.  Neck: Neck supple.  Cardiovascular: Normal rate, regular rhythm and normal heart sounds.   No  murmur heard. Pulmonary/Chest: Effort normal and breath sounds normal.  Abdominal: Soft. There is no tenderness.  No HSM  Lymphadenopathy:    He has cervical adenopathy.  Neurological: He is alert and oriented to person, place, and time.  Skin: No rash noted. He is not diaphoretic.    ED Course  Procedures (including critical care time)  Labs Reviewed  POCT RAPID STREP A ([facility] ONLY) - Abnormal; Notable for the following:    Streptococcus, Group A Screen (Direct) POSITIVE (*)    All other components within normal limits   No results found.   1. Strep pharyngitis       MDM  Patient with exudative pharyngotonsillitis. patent airways. Pos strep. Treated with keflex, ibuprofen, prednisone, omeprazole,  promethazine. Supportive care and red flags that should prompt his return to medical attention discussed with patient and provided in writing.         Sharin Grave, MD 03/22/13 [phone]

## 2013-08-31 ENCOUNTER — Ambulatory Visit (INDEPENDENT_AMBULATORY_CARE_PROVIDER_SITE_OTHER): Payer: BC Managed Care – PPO | Admitting: Physician Assistant

## 2013-08-31 VITALS — BP 110/70 | HR 60 | Temp 98.4°F | Resp 16 | Ht 67.0 in | Wt 156.0 lb

## 2013-08-31 DIAGNOSIS — J069 Acute upper respiratory infection, unspecified: Secondary | ICD-10-CM

## 2013-08-31 DIAGNOSIS — J029 Acute pharyngitis, unspecified: Secondary | ICD-10-CM

## 2013-08-31 DIAGNOSIS — J309 Allergic rhinitis, unspecified: Secondary | ICD-10-CM

## 2013-08-31 LAB — POCT RAPID STREP A (OFFICE): Rapid Strep A Screen: NEGATIVE

## 2013-08-31 MED ORDER — FIRST-DUKES MOUTHWASH MT SUSP: 10.0000 mL | OROMUCOSAL | Status: DC | PRN

## 2013-08-31 MED ORDER — CETIRIZINE HCL 10 MG PO TABS: 10.0000 mg | ORAL_TABLET | Freq: Every day | ORAL | Status: DC

## 2013-08-31 MED ORDER — FLUTICASONE PROPIONATE 50 MCG/ACT NA SUSP: 2.0000 | Freq: Every day | NASAL | Status: DC

## 2013-08-31 NOTE — Patient Instructions (Addendum)
Take the cetirizine once daily - this will help with runny nose and sneezing.  Use the fluticasone nasal spray once every day - this will also help with runny nose, sneezing, and post nasal drainage  Use the magic mouthwash as frequently as every 2 hours as needed for throat pain  Take 600mg  ibuprofen every 8 hours with food for throat pain.  Drink plenty of fluids (water is best!)  Please let me know if any symptoms are worsening or not improving   Infeccin de las vas areas superiores en los adultos (Upper Respiratory Infection, Adult)  La infeccin respiratoria de las vas areas superiores se conoce tambin como resfro comn. Las vas areas superiores [employer] senos nasales, la garganta, la trquea, y los bronquios. Los bronquios son las vas areas que conducen el aire a los pulmones. La mayor parte de las personas mejora luego de una [place], Biomedical engineer los sntomas pueden durar [employer]. La tos residual puede durar ms. CAUSAS Varios tipos de virus pueden causar la infeccin de los tejidos que cubren las vas areas superiores. Los tejidos se irritan y se inflaman y se originan secreciones. Tambin es frecuente la produccin de moco. El resfro es contagioso. El virus se disemina fcilmente a otras personas por contacto oral. Aqu se incluyen los besos, el compartir un vaso y el toser o Engineering geologist. Tambin puede diseminarse tocndose la boca o la [place] y luego tocando una superficie que luego tocan Economist.  SNTOMAS Los sntomas se desarrollan entre uno y [place] luego de Cytogeneticist en contacto con el virus. Pueden variar de Neomia Dear persona a otra. Incluyen:  Secrecin nasal.  Estornudos  Congestin nasal.  Irritacin de los senos nasales.  Dolor de Advertising copywriter.  Prdida de la voz (laringitis).  Tos.  Fatiga.  Dolores musculares.  Prdida del apetito.  Dolor de [place].  Fiebre no muy elevada. DIAGNSTICO Puede diagnosticarse a s mismo la infeccin  respiratoria, segn los sntomas habituales, ya que la mayor parte de las personas se resfra dos o tres veces al ao. El profesional puede confirmarlo basndose en el examen fsico. Lo ms importante es que el profesional verifique que los sntomas no se deben a otra enfermedad como anginas, sinusitis, neumona, asma o epiglotitis. Para diagnosticar el resfrio comn, no es necesario que haga anlisis de [place], pruebas en la garganta o radiografas, pero en algunos casos puede ser de utilidad para excluir otros problemas ms graves. El mdico decidir si necesita otras pruebas. RIESGOS Y COMPLICACIONES Tendr mayor riesgo de sufrir un resfro grave si consume cigarrillos, sufre una enfermedad cardaca (como insuficiencia cardaca) o pulmonar crnica (como asma) o si tiene un debilitamiento del sistema inmunolgico. Las personas muy jvenes o muy mayores tienen riesgo de sufrir infecciones ms graves. La sinusitis bacteriana, las infecciones del odo medio y la neumona bacteriana pueden complicar el resfro comn. El resfro puede exacerbar el asma y la enfermedad pulmonar obstructiva crnica. En algunos casos estas complicaciones requieren la atencin en un servicio de emergencias y pueden poner en peligro la vida. PREVENCIN La mejor manera de protegerse para no contraer un resfro es Pharmacologist una buena higiene. Evite el contacto bucal o de las manos con personas con sntomas de resfro. Si se produce el contacto, lvese las manos con frecuencia. No hay pruebas firmes que indiquen que la vitamina C, la vitamina E, la equincea o la actividad fsica reduzcan las posibilidades de tener una infeccin. Sin embargo, siempre se recomienda Insurance account manager y Winferd Humphrey buena  nutricin. TRATAMIENTO El tratamiento est dirigido a Consulting civil engineer sntomas. Esta enfermedad no tiene [place]. Los antibiticos no son eficaces, ya que esta infeccin la causa un virus y no una bacteria. El tratamiento incluye:  Aumente la ingesta  de lquidos. Consumo de bebidas deportivas, que proporcionan electrolitos,azcares e hidratacin.  Inhale vapor caliente (de un vaporizador o de la ducha).  Tomar sopa de pollo u otros lquidos claros, y [employer] buena nutricin.  Descanse lo suficiente.  Haga grgaras o coma pastillas para [employer].  Control de la fiebre con ibuprofeno o acetaminofen, segn las indicaciones del mdico.  Aumento del uso del inhalador, si sufre asma. Las pastillas y los geles de zinc durante las primeras 24 horas de iniciado el resfro comn, pueden disminuir la duracin y Paramedic la gravedad de los sntomas. Los medicamentos para Chief Technology Officer pueden disminuir la fiebre, Paramedic los dolores musculares y Chief Technology Officer de Advertising copywriter. Se dispone de una gran variedad de medicamentos de venta libre para tratar la congestin y la secrecin nasal. El profesional podr recomendarle inhalantes para los otros sntomas. INSTRUCCIONES PARA EL CUIDADO DOMICILIARIO  Utilice los medicamentos de venta libre o de prescripcin para Chief Technology Officer, el malestar o la [place], segn se lo indique el profesional que lo asiste.  Utilice un vaporizador caliente o inhale vapor, haciendo salir agua de la ducha para aumentar la humedad [place]. Esto mantendr las secreciones hmedas y Community education officer ms fcil respirar.  Beba gran cantidad de lquido para mantener la orina de tono claro o color amarillo plido.  Descanse todo lo que pueda.  Regrese a su trabajo cuando la temperatura se haya normalizado, o cuando el profesional que lo asiste se lo indique. Quizs sea necesario que permanezca en su casa durante un tiempo ms prolongado para Buyer, retail a Economist. Tambin puede utilizar un barbijo y ser cuidadoso con el lavado de manos para evitar la diseminacin del virus. SOLICITE ATENCIN MDICA SI:  Luego de los primeros das siente que empeora en vez de [place].  Necesita que Occupational psychologist brinde ms informacin  relacionada con los medicamentos para [employer] sntomas.  Siente escalofros, le falta el aire o escupe moco de color marrn o rojo. Estos pueden ser sntomas de neumona.  Tiene una secrecin nasal de color amarillo o marrn, o siente dolor en el rostro, especialmente cuando se inclina hacia adelante. Estos pueden ser sntomas de sinusitis.  Tiene fiebre, siente el cuello hinchado, tiene dolor al tragar u observa manchas blancas en el fondo de la garganta. Estos pueden ser sntomas de angina por estreptococo. Romona Curls ATENCIN MDICA DE INMEDIATO SI:  Lance Muss.  Comienza a sentir Herbalist de cabeza intenso o persistente, dolor de odos, en el seno nasal o en el pecho.  Tiene tos y esta se prolonga demasiado, tose y escupe sangre, la mucosidad habitual se modifica (si tiene una enfermedad pulmonar crnica) o respira con dificultad.  Siente rigidez en el cuello o dolor de cabeza intenso. Document Released: 08/22/2005 Document Revised: 02/04/2012 [facility] Patient Information 2014 [place], [place].

## 2013-08-31 NOTE — Progress Notes (Signed)
  Subjective:    Patient ID: William Owens, male    DOB: 07-05-1981, 32 y.o.   MRN: [id number]  HPI   [name] is a very pleasant 32 yr old male here with concern for illness.  Reports he has been sick for 3-4 days.  Sore throat, some "white stuff" on left tonsil.  Throat pain is bilateral.  Feels swollen.  Also with runny nose and sneezing, esp when he goes outside.  No cough.  No fever.  +left ear pain.  No sick contacts.  Took some Tylenol for symptoms.     Review of Systems  Constitutional: Negative for fever and chills.  HENT: Positive for ear pain (left), congestion, sore throat, rhinorrhea and sneezing.   Respiratory: Negative for cough, shortness of breath and wheezing.   Cardiovascular: Negative.   Gastrointestinal: Negative.   Musculoskeletal: Negative.   Skin: Negative.   Neurological: Negative.        Objective:   Physical Exam  Vitals reviewed. Constitutional: He is oriented to person, place, and time. He appears well-developed and well-nourished. No distress.  HENT:  Head: Normocephalic and atraumatic.  Right Ear: Tympanic membrane and ear canal normal.  Left Ear: A middle ear effusion is present.  Nose: Mucosal edema present. Right sinus exhibits no maxillary sinus tenderness and no frontal sinus tenderness. Left sinus exhibits no maxillary sinus tenderness and no frontal sinus tenderness.  Mouth/Throat: Uvula is midline and mucous membranes are normal. Posterior oropharyngeal edema and posterior oropharyngeal erythema present. No oropharyngeal exudate or tonsillar abscesses.  Post-nasal drainage visible in posterior pharynx  Eyes: Conjunctivae are normal. No scleral icterus.  Neck: Neck supple.  Cardiovascular: Normal rate, regular rhythm and normal heart sounds.   Pulmonary/Chest: Effort normal and breath sounds normal. He has no wheezes. He has no rales.  Abdominal: Soft. There is no tenderness.  Lymphadenopathy:    He has no cervical  adenopathy.  Neurological: He is alert and oriented to person, place, and time.  Skin: Skin is warm and dry.  Psychiatric: He has a normal mood and affect. His behavior is normal.    Results for orders placed in visit on 08/31/13  POCT RAPID STREP A (OFFICE)      Result Value Range   Rapid Strep A Screen Negative  Negative         Assessment & Plan:  Acute pharyngitis - Plan: Culture, Group A Strep, POCT rapid strep A, Diphenhyd-Hydrocort-Nystatin (FIRST-DUKES MOUTHWASH) SUSP  Viral URI - Plan: fluticasone (FLONASE) 50 MCG/ACT nasal spray, cetirizine (ZYRTEC) 10 MG tablet  Allergic rhinitis - Plan: fluticasone (FLONASE) 50 MCG/ACT nasal spray, cetirizine (ZYRTEC) 10 MG tablet   [name] is a very pleasant 31 yr old male here with sore throat and nasal drainage.  Rapid strep is negative.  Suspect viral URI, possibly allergic rhinitis as symptoms worsen when pt is outside.  Will treat with zyrtec, flonase, magic mouthwash, motrin.  Push fluids.  Pt works in dusty env that irritates symptoms, encouraged him to wear a mask.  Throat cx sent, will follow up.  Call or RTC if worsening or not improving.

## 2013-11-05 ENCOUNTER — Ambulatory Visit (INDEPENDENT_AMBULATORY_CARE_PROVIDER_SITE_OTHER): Payer: BC Managed Care – PPO | Admitting: Family Medicine

## 2013-11-05 VITALS — BP 124/68 | HR 64 | Temp 98.2°F | Resp 16 | Ht 67.5 in | Wt 153.0 lb

## 2013-11-05 DIAGNOSIS — J029 Acute pharyngitis, unspecified: Secondary | ICD-10-CM

## 2013-11-05 LAB — POCT RAPID STREP A (OFFICE): Rapid Strep A Screen: NEGATIVE

## 2013-11-05 NOTE — Patient Instructions (Signed)
Take claritin D or allegra D or zyrtec D one daily  Return if worse  Faringitis Viral (Viral Pharyngitis)  La faringitis virales una infeccin viral que produce enrojecimiento, dolor e hinchazn (inflamacin) en la garganta. No se disemina de [place] persona a otra (no es contagiosa). CAUSAS La causa es la inhalacin de una gran cantidad de grmenes llamados virus. Muchos virus diferentes pueden causar faringitis viral. SNTOMAS Los sntomas de faringitis viral son:  Dolor de Estate agent.  Nariz tapada.  Fiebre no muy elevada  Congestin  Tos TRATAMIENTO El tratamiento incluye reposo, beber muchos lquidos y el uso de medicamentos de venta libre (autorizados por el mdico) INSTRUCCIONES PARA EL CUIDADO EN EL HOGAR   Debe ingerir gran cantidad de lquido para mantener la orina de tono claro o color amarillo plido.  Consuma alimentos blandos, fros, como helados de crema, de agua o gelatina.  Puede hacer grgaras con agua tibia con sal (una cucharadita en 1 litro de agua).  Despus de los 7 aos, pueden administrarse pastillas para la tos con seguridad.  Solo tome medicamentos que se pueden comprar sin receta o recetados para Chief Technology Officer, Dentist o fiebre, como le indica el mdico. No tome aspirina Para no contagiar evite:  El contacto boca a boca con Economist.  Compartir utensilios para comer o beber.  Toser cerca de otras personas SOLICITE ATENCIN MDICA SI:   Mejora luego de [employer] luego [place].  Tiene fiebre o siente un dolor intenso que no puede ser controlado con los medicamentos.  Hay otros cambios que lo preocupan. Document Released: 08/22/2005 Document Revised: 02/04/2012 [facility] Patient Information 2014 [place], [place].

## 2013-11-05 NOTE — Progress Notes (Signed)
Subjective: 32 year old man who has a three-day history of upper respiratory infection and sore throat. The throat continues to be sore. He has had chills but not sure she's had a fever. He is not coughing much. 3 children have coughs also.  Objective: Healthy-appearing man. His TMs are normal. Throat erythematous with a lot of postnasal drainage but no exudate. Neck was supple without significant nodes. Chest clear to auscultation. Heart regular without murmurs.  Assessment: Pharyngitis, acute  Plan: Strep test and culture if needed  Results for orders placed in visit on 11/05/13  POCT RAPID STREP A (OFFICE)      Result Value Range   Rapid Strep A Screen Negative  Negative

## 2013-11-08 LAB — CULTURE, GROUP A STREP: Organism ID, Bacteria: NORMAL

## 2015-01-01 ENCOUNTER — Ambulatory Visit (INDEPENDENT_AMBULATORY_CARE_PROVIDER_SITE_OTHER): Payer: BLUE CROSS/BLUE SHIELD | Admitting: Family Medicine

## 2015-01-01 VITALS — BP 130/74 | HR 77 | Temp 97.8°F | Resp 16 | Ht 66.75 in | Wt 150.4 lb

## 2015-01-01 DIAGNOSIS — R350 Frequency of micturition: Secondary | ICD-10-CM

## 2015-01-01 LAB — POCT URINALYSIS DIPSTICK
BILIRUBIN UA: NEGATIVE
Blood, UA: NEGATIVE
GLUCOSE UA: NEGATIVE
Ketones, UA: NEGATIVE
LEUKOCYTES UA: NEGATIVE
Nitrite, UA: NEGATIVE
PH UA: 7.5
PROTEIN UA: NEGATIVE
SPEC GRAV UA: 1.02
Urobilinogen, UA: 0.2

## 2015-01-01 LAB — POCT CBC
Granulocyte percent: 49.4 %G (ref 37–80)
HEMATOCRIT: 49.2 % (ref 43.5–53.7)
HEMOGLOBIN: 15.8 g/dL (ref 14.1–18.1)
Lymph, poc: 2.1 (ref 0.6–3.4)
MCH: 30.2 pg (ref 27–31.2)
MCHC: 32.2 g/dL (ref 31.8–35.4)
MCV: 93.8 fL (ref 80–97)
MID (CBC): 0.4 (ref 0–0.9)
MPV: 8.2 fL (ref 0–99.8)
POC GRANULOCYTE: 2.5 (ref 2–6.9)
POC LYMPH PERCENT: 42.6 %L (ref 10–50)
POC MID %: 8 %M (ref 0–12)
Platelet Count, POC: 197 10*3/uL (ref 142–424)
RBC: 5.24 M/uL (ref 4.69–6.13)
RDW, POC: 15.3 %
WBC: 5 10*3/uL (ref 4.6–10.2)

## 2015-01-01 LAB — POCT UA - MICROSCOPIC ONLY
BACTERIA, U MICROSCOPIC: NEGATIVE
Epithelial cells, urine per micros: NEGATIVE
MUCUS UA: NEGATIVE
WBC, UR, HPF, POC: NEGATIVE

## 2015-01-01 LAB — GLUCOSE, POCT (MANUAL RESULT ENTRY): POC Glucose: 103 mg/dl — AB (ref 70–99)

## 2015-01-01 NOTE — Patient Instructions (Signed)
No evidence of any abnormality.  I think you should just live with your symptoms for now, and that you will gradually returned back to normal.  If you get worse come back and we will recheck and consider doing other tests. I do not believe any other tests are indicated today.  Keep drinking plenty of water and getting regular exercise.  Return as necessary

## 2015-01-01 NOTE — Progress Notes (Signed)
Subjective: 34 year old man who is here with a three-day history of urinary frequency. 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 He gets to where he has to urinate about every 30 minutes. He does drink about 4 bottles of water a day. He is not having excessive thirst. No nausea or vomiting. Occasional heartburn. He has been having some intermittent back pain in his flanks. No dysuria but he has urinary frequency and some low abdominal pain. His bowels act daily. He speaks some [place], his wife speaks better.  Objective: No acute distress. Chest clear. Heart regular without murmurs. Abdomen soft without masses or tenderness. No CVA tenderness. Good range of motion of the spine.  Assessment: Urinary frequency Low abdominal pain  Plan: Labs Results for orders placed or performed in visit on 01/01/15  POCT UA - Microscopic Only  Result Value Ref Range   WBC, Ur, HPF, POC Negative    RBC, urine, microscopic 0-1    Bacteria, U Microscopic Negative    Mucus, UA Negative    Epithelial cells, urine per micros Negative    Crystals, Ur, HPF, POC     Casts, Ur, LPF, POC     Yeast, UA    POCT urinalysis dipstick  Result Value Ref Range   Color, UA Yellow    Clarity, UA Clear    Glucose, UA Negative    Bilirubin, UA Negative    Ketones, UA Negative    Spec Grav, UA 1.020    Blood, UA Negative    pH, UA 7.5    Protein, UA Negative    Urobilinogen, UA 0.2    Nitrite, UA Negative    Leukocytes, UA Negative   POCT CBC  Result Value Ref Range   WBC 5.0 4.6 - 10.2 K/uL   Lymph, poc 2.1 0.6 - 3.4   POC LYMPH PERCENT 42.6 10 - 50 %L   MID (cbc) 0.4 0 - 0.9   POC MID % 8.0 0 - 12 %M   POC Granulocyte 2.5 2 - 6.9   Granulocyte percent 49.4 37 - 80 %G   RBC 5.24 4.69 - 6.13 M/uL   Hemoglobin 15.8 14.1 - 18.1 g/dL   HCT, POC 16.149.2 09.643.5 - 53.7 %   MCV 93.8 80 - 97 fL   MCH, POC 30.2 27 - 31.2 pg   MCHC 32.2 31.8 - 35.4 g/dL   RDW, POC 04.515.3 %   Platelet Count, POC 197.0  142 - 424 K/uL   MPV 8.2 0 - 99.8 fL  POCT glucose (manual entry)  Result Value Ref Range   POC Glucose 103 (A) 70 - 99 mg/dl

## 2015-01-03 LAB — GC/CHLAMYDIA PROBE AMP
CT Probe RNA: NEGATIVE
GC Probe RNA: NEGATIVE

## 2015-01-23 ENCOUNTER — Ambulatory Visit (INDEPENDENT_AMBULATORY_CARE_PROVIDER_SITE_OTHER): Payer: BLUE CROSS/BLUE SHIELD | Admitting: Physician Assistant

## 2015-01-23 VITALS — BP 122/80 | HR 67 | Temp 98.2°F | Resp 17 | Ht 68.0 in | Wt 153.0 lb

## 2015-01-23 DIAGNOSIS — J069 Acute upper respiratory infection, unspecified: Secondary | ICD-10-CM

## 2015-01-23 DIAGNOSIS — J309 Allergic rhinitis, unspecified: Secondary | ICD-10-CM

## 2015-01-23 MED ORDER — FLUTICASONE PROPIONATE 50 MCG/ACT NA SUSP: 2.0000 | Freq: Every day | NASAL | Status: DC

## 2015-01-23 MED ORDER — IPRATROPIUM BROMIDE 0.03 % NA SOLN: 2.0000 | Freq: Two times a day (BID) | NASAL | Status: DC

## 2015-01-23 MED ORDER — CETIRIZINE HCL 10 MG PO TABS: 10.0000 mg | ORAL_TABLET | Freq: Every day | ORAL | Status: DC

## 2015-01-23 MED ORDER — GUAIFENESIN ER 1200 MG PO TB12: 1.0000 | ORAL_TABLET | Freq: Two times a day (BID) | ORAL | Status: DC | PRN

## 2015-01-23 NOTE — Patient Instructions (Signed)
Start taking zyrtec daily again. Use atrovent nasal spray twice a day for 2 weeks. Then start using new flonase prescription daily thereafter. Take mucinex twice a day - drink plenty of water with this (64 0z/day) Call me in 4-5 days if you are not getting any better and I will send an antibiotic in for you.

## 2015-01-23 NOTE — Progress Notes (Signed)
Subjective:    Patient ID: William Owens, male    DOB: 05/09/1981, 34 y.o.   MRN: [id number]  HPI  This is a 34 year old male with PMH allergic rhinitis presenting with 5 days of nasal congestion, sore throat and right otalgia. Nasal discharge is brown. Having sinus pressure. Having some chills but no fever. He is having sore throat with one side looking more swollen than the other. Denies cough, SOB, wheezing. Tried nyquil and flonase without any help. No sick contacts. No history of asthma and he is not a smoker. Pt has had more problems with allergic symptoms in the past 2 years. Pollen bothers him in the spring. 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 He does not currently use zyrtec or flonase on a regular basis.  Review of Systems  Constitutional: Negative for fever and chills.  HENT: Positive for congestion, ear pain, sinus pressure and sore throat.   Eyes: Negative for redness.  Respiratory: Negative for cough, shortness of breath and wheezing.   Gastrointestinal: Negative for nausea, vomiting and diarrhea.  Skin: Negative for rash.  Allergic/Immunologic: Positive for environmental allergies.  Hematological: Negative for adenopathy.    Patient Active Problem List   Diagnosis Date Noted  . FATIGUE 11/24/2010  . GERD 05/13/2008  . CONSTIPATION 05/13/2008   Prior to Admission medications   Medication Sig Start Date End Date Taking? Authorizing Provider  fluticasone (FLONASE) 50 MCG/ACT nasal spray Place 2 sprays into the nose daily. 08/31/13  Yes Godfrey PickEleanore E Egan, PA-C                 No Known Allergies  Patient's social and family history were reviewed.     Objective:   Physical Exam  Constitutional: He is oriented to person, place, and time. He appears well-developed and well-nourished. No distress.  HENT:  Head: Normocephalic and atraumatic.  Right Ear: Hearing, external ear and ear canal normal. Tympanic membrane is  retracted.  Left Ear: Hearing, tympanic membrane, external ear and ear canal normal.  Nose: Mucosal edema present. Right sinus exhibits maxillary sinus tenderness. Right sinus exhibits no frontal sinus tenderness. Left sinus exhibits no maxillary sinus tenderness and no frontal sinus tenderness.  Mouth/Throat: Uvula is midline and mucous membranes are normal. Posterior oropharyngeal erythema present. No oropharyngeal exudate or posterior oropharyngeal edema.  Eyes: Conjunctivae and lids are normal. Right eye exhibits no discharge. Left eye exhibits no discharge. No scleral icterus.  Cardiovascular: Normal rate, regular rhythm, normal heart sounds, intact distal pulses and normal pulses.   No murmur heard. Pulmonary/Chest: Effort normal and breath sounds normal. No respiratory distress. He has no wheezes. He has no rhonchi. He has no rales.  Musculoskeletal: Normal range of motion.  Lymphadenopathy:  Bilateral shotty anterior cervical lymphadenopathy  Neurological: He is alert and oriented to person, place, and time.  Skin: Skin is warm, dry and intact. No lesion and no rash noted.  Psychiatric: He has a normal mood and affect. His speech is normal and behavior is normal. Thought content normal.   BP 122/80 mmHg  Pulse 67  Temp(Src) 98.2 F (36.8 C) (Oral)  Resp 17  Ht 5\' 8"  (1.727 m)  Wt 153 lb (69.4 kg)  BMI 23.27 kg/m2  SpO2 98%     Assessment & Plan:  1. Viral URI 2. Allergic rhinitis Illness is likely viral at this point. Will treat with atrovent and mucinex. If not getting better in  4-5 days, he will call and I will send in antibiotics. Refilled zyrtec and flonase for allergic symptoms which are worse in the spring. He will start taking zyrtec daily. In two weeks after done with atrovent, will start using flonase daily.  - ipratropium (ATROVENT) 0.03 % nasal spray; Place 2 sprays into both nostrils 2 (two) times daily.  Dispense: 30 mL; Refill: 0 - Guaifenesin (MUCINEX MAXIMUM  STRENGTH) 1200 MG TB12; Take 1 tablet (1,200 mg total) by mouth every 12 (twelve) hours as needed.  Dispense: 14 tablet; Refill: 1 - cetirizine (ZYRTEC) 10 MG tablet; Take 1 tablet (10 mg total) by mouth daily.  Dispense: 30 tablet; Refill: 11 - fluticasone (FLONASE) 50 MCG/ACT nasal spray; Place 2 sprays into both nostrils daily.  Dispense: 16 g; Refill: 12   William Owens, MHS [facility]  01/23/2015

## 2015-10-30 ENCOUNTER — Ambulatory Visit (INDEPENDENT_AMBULATORY_CARE_PROVIDER_SITE_OTHER): Payer: BLUE CROSS/BLUE SHIELD | Admitting: Physician Assistant

## 2015-10-30 VITALS — BP 130/70 | HR 98 | Temp 98.6°F | Resp 18 | Ht 68.0 in | Wt 154.8 lb

## 2015-10-30 DIAGNOSIS — J029 Acute pharyngitis, unspecified: Secondary | ICD-10-CM | POA: Diagnosis not present

## 2015-10-30 DIAGNOSIS — J302 Other seasonal allergic rhinitis: Secondary | ICD-10-CM | POA: Diagnosis not present

## 2015-10-30 DIAGNOSIS — J020 Streptococcal pharyngitis: Secondary | ICD-10-CM | POA: Diagnosis not present

## 2015-10-30 LAB — POCT RAPID STREP A (OFFICE): Rapid Strep A Screen: POSITIVE — AB

## 2015-10-30 MED ORDER — AMOXICILLIN 875 MG PO TABS: 875.0000 mg | ORAL_TABLET | Freq: Two times a day (BID) | ORAL | Status: DC

## 2015-10-30 MED ORDER — CETIRIZINE HCL 10 MG PO TABS: 10.0000 mg | ORAL_TABLET | Freq: Every day | ORAL | Status: DC

## 2015-10-30 NOTE — Progress Notes (Signed)
10/31/2015 9:49 AM   DOB: Jul 08, 1981 / MRN: [id number]  SUBJECTIVE:  William Owens is a 34 y.o. male presenting for for the evaluation of sore throat that started 4 days ago.  Associated symptoms include no other sympoms today and he denies fever, difficulty breathing, headache and jaw pain. Treatments tried thus far include nothing. He denies sick contacts.  He has No Known Allergies.   He  has a past medical history of Diverticulitis and Allergy.    He  reports that he has quit smoking. He does not have any smokeless tobacco history on file. He reports that he drinks about 1.2 oz of alcohol per week. He reports that he does not use illicit drugs. He  has no sexual activity history on file. The patient  has no past surgical history on file.  His family history is not on file.  Review of Systems  Constitutional: Negative for fever, chills, malaise/fatigue and diaphoresis.  HENT: Positive for sore throat. Negative for congestion.   Respiratory: Negative for cough, hemoptysis, shortness of breath and wheezing.   Cardiovascular: Negative for chest pain.  Gastrointestinal: Negative for nausea.  Skin: Negative for rash.  Neurological: Negative for dizziness and weakness.  Endo/Heme/Allergies: Negative for polydipsia.    Problem list and medications reviewed and updated by myself where necessary, and exist elsewhere in the encounter.   OBJECTIVE:  BP 130/70 mmHg  Pulse 98  Temp(Src) 98.6 F (37 C) (Oral)  Resp 18  Ht 5\' 8"  (1.727 m)  Wt 154 lb 12.8 oz (70.217 kg)  BMI 23.54 kg/m2  SpO2 98%  Physical Exam  Constitutional: He is oriented to person, place, and time. He appears well-developed. He does not appear ill.  HENT:  Mouth/Throat:    Eyes: Conjunctivae and EOM are normal. Pupils are equal, round, and reactive to light.  Cardiovascular: Normal rate.   Pulmonary/Chest: Effort normal.  Abdominal: He exhibits no distension.  Musculoskeletal: Normal range of  motion.  Neurological: He is alert and oriented to person, place, and time. No cranial nerve deficit. Coordination normal.  Skin: Skin is warm and dry. He is not diaphoretic.  Psychiatric: He has a normal mood and affect.  Nursing note and vitals reviewed.   Results for orders placed or performed in visit on 10/30/15 (from the past 48 hour(s))  POCT rapid strep A     Status: Abnormal   Collection Time: 10/30/15  1:29 PM  Result Value Ref Range   Rapid Strep A Screen Positive (A) Negative    ASSESSMENT AND PLAN:  William Owens was seen today for sore throat and chills.  Diagnoses and all orders for this visit:  Sore throat -     POCT rapid strep A -     Cancel: Culture, Group A Strep  Streptococcal sore throat: Positive RADT.  Will cancel culture.  Advised he avoid contact with his 298 month old for the next 48 hours.   -     amoxicillin (AMOXIL) 875 MG tablet; Take 1 tablet (875 mg total) by mouth 2 (two) times daily.  Seasonal allergies: Historical diagnosis.  Patient inquired about the most effective oral therapy.  -     cetirizine (ZYRTEC) 10 MG tablet; Take 1 tablet (10 mg total) by mouth daily.   The patient was advised to call or return to clinic if he does not see an improvement in symptoms or to seek the care of the closest emergency department if he worsens with the above plan.  Deliah Boston, MHS, PA-C [facility] 10/31/2015 9:49 AM

## 2015-10-31 NOTE — Progress Notes (Signed)
  Medical screening examination/treatment/procedure(s) were performed by non-physician practitioner and as supervising physician I was immediately available for consultation/collaboration.     

## 2016-02-19 ENCOUNTER — Other Ambulatory Visit: Payer: Self-pay | Admitting: Physician Assistant

## 2016-03-29 ENCOUNTER — Ambulatory Visit (INDEPENDENT_AMBULATORY_CARE_PROVIDER_SITE_OTHER): Payer: BLUE CROSS/BLUE SHIELD | Admitting: Physician Assistant

## 2016-03-29 VITALS — BP 120/80 | HR 86 | Temp 98.6°F | Resp 16 | Ht 67.0 in | Wt 153.0 lb

## 2016-03-29 DIAGNOSIS — H9201 Otalgia, right ear: Secondary | ICD-10-CM

## 2016-03-29 DIAGNOSIS — J029 Acute pharyngitis, unspecified: Secondary | ICD-10-CM

## 2016-03-29 LAB — POCT RAPID STREP A (OFFICE): RAPID STREP A SCREEN: NEGATIVE

## 2016-03-29 MED ORDER — AMOXICILLIN 875 MG PO TABS: 875.0000 mg | ORAL_TABLET | Freq: Two times a day (BID) | ORAL | Status: DC

## 2016-03-29 NOTE — Progress Notes (Signed)
04/01/2016 10:03 AM   DOB: 04-Sep-1981 / MRN: [id number]  SUBJECTIVE:  William Owens is a 35 y.o. male presenting for sore throat that started three days ago.  Also complains of right sided ear pain.  He denies fever, however has felt chills.  I saw him roughly 4 months ago for similar symptoms and he tested positive for group A strep. He has a history of allergies and has been taking flonase and zyrtec and this has given him good relief of sneezing and rhinorrhea.       He has No Known Allergies.   He  has a past medical history of Diverticulitis and Allergy.    He  reports that he has quit smoking. He has never used smokeless tobacco. He reports that he drinks about 1.2 oz of alcohol per week. He reports that he does not use illicit drugs. He  has no sexual activity history on file. The patient  has no past surgical history on file.  His family history is not on file.  Review of Systems  Constitutional: Positive for chills. Negative for fever.  Skin: Negative for rash.  Neurological: Negative for dizziness and headaches.    Problem list and medications reviewed and updated by myself where necessary, and exist elsewhere in the encounter.   OBJECTIVE:  BP 120/80 mmHg  Pulse 86  Temp(Src) 98.6 F (37 C) (Oral)  Resp 16  Ht 5\' 7"  (1.702 m)  Wt 153 lb (69.4 kg)  BMI 23.96 kg/m2  SpO2 98%  Physical Exam  Constitutional: He is oriented to person, place, and time. He appears well-developed. He does not appear ill.  HENT:  Right Ear: No tenderness. Tympanic membrane is injected. A middle ear effusion is present. Decreased hearing is noted.  Left Ear: Hearing and tympanic membrane normal.  Nose: Nose normal.  Mouth/Throat: Uvula is midline. Posterior oropharyngeal erythema present.  Weber lateralizes to right.  Eyes: Conjunctivae and EOM are normal. Pupils are equal, round, and reactive to light.  Cardiovascular: Normal rate.   Pulmonary/Chest: Effort normal.    Abdominal: He exhibits no distension.  Musculoskeletal: Normal range of motion.  Neurological: He is alert and oriented to person, place, and time. No cranial nerve deficit. Coordination normal.  Skin: Skin is warm and dry. He is not diaphoretic.  Psychiatric: He has a normal mood and affect.  Nursing note and vitals reviewed.   Results for orders placed or performed in visit on 03/29/16 (from the past 72 hour(s))  POCT rapid strep A     Status: None   Collection Time: 03/29/16  6:57 PM  Result Value Ref Range   Rapid Strep A Screen Negative Negative    No results found.  ASSESSMENT AND PLAN  William Owens was seen today for ear pain and throat pain.  Diagnoses and all orders for this visit:  Sore throat: Strep negative.  His throat is beefy red.  Given his history of positive strep I will start Amox, however this may also be a right otitis media given positive weber.   -     POCT rapid strep A -     Culture, Group A Strep  Ear pain, right: See problem 1.  -     amoxicillin (AMOXIL) 875 MG tablet; Take 1 tablet (875 mg total) by mouth 2 (two) times daily.    The patient was advised to call or return to clinic if he does not see an improvement in symptoms or to seek the  care of the closest emergency department if he worsens with the above plan.   William Owens, MHS, PA-C [facility] 04/01/2016 10:03 AM   Addendum: Strep culture positive.  Will continue therapy. William Boston, MS, PA-C 11:48 AM, 04/02/2016

## 2016-03-29 NOTE — Patient Instructions (Addendum)
Take Ibuprofen 600-800 mg every 8 hours as needed for throat and ear pain. I will call you if your culture is negative so that you can stop the antibiotics.     IF you received an x-ray today, you will receive an invoice from [facility]. Please contact [facility] at [phone] with questions or concerns regarding your invoice.   IF you received labwork today, you will receive an invoice from [facility]. Please contact [facility] at [phone] with questions or concerns regarding your invoice.   Our billing staff will not be able to assist you with questions regarding bills from these companies.  You will be contacted with the lab results as soon as they are available. The fastest way to get your results is to activate your My Chart account. Instructions are located on the last page of this paperwork. If you have not heard from us regarding the results in 2 weeks, please contact this office.

## 2016-04-01 LAB — CULTURE, GROUP A STREP

## 2016-06-09 ENCOUNTER — Ambulatory Visit (INDEPENDENT_AMBULATORY_CARE_PROVIDER_SITE_OTHER): Payer: BLUE CROSS/BLUE SHIELD | Admitting: Family Medicine

## 2016-06-09 VITALS — BP 100/68 | HR 77 | Temp 98.7°F | Resp 12 | Ht 67.0 in | Wt 153.0 lb

## 2016-06-09 DIAGNOSIS — H60391 Other infective otitis externa, right ear: Secondary | ICD-10-CM | POA: Diagnosis not present

## 2016-06-09 MED ORDER — NEOMYCIN-POLYMYXIN-HC 3.5-10000-1 OT SOLN: 3.0000 [drp] | Freq: Four times a day (QID) | OTIC | Status: DC

## 2016-06-09 NOTE — Patient Instructions (Addendum)
Thank you for coming in today. Return as needed.  Use the drops 4x a day.   Otitis externa (Otitis Externa) La otitis externa es una infeccin bacteriana o por hongos en el conducto auditivo externo. Esta es el rea desde el tmpano hasta el exterior de la [place]. Tambin se llama "odo de nadador". CAUSAS  Las posibles causas de la infeccin son:  Alen Bleacheradar en agua sucia.  Humedad que queda en el odo despus de nadar o baarse.  Lesin leve en el odo (traumatismo).  Objetos atascados en el odo (cuerpo extrao).  Cortes o raspones (abrasiones) en la parte exterior del odo. SIGNOS Y SNTOMAS  En general, el primer sntoma de infeccin es la picazn en el canal auditivo. Ms tarde, los signos y los sntomas pueden ser hinchazn y enrojecimiento del conducto auditivo, dolor de odo y supuracin de lquido de color blanco amarillento (pus). El Engineer, miningdolor de odo puede empeorar cuando tira el lbulo de la Foot of [place]. DIAGNSTICO  Su mdico le Medical sales representativerealizar un examen fsico. Podr tomar Lauris Poaguna muestra de lquido de la oreja y Engineer, manufacturingdetectar bacterias u hongos. TRATAMIENTO  Las gotas antibiticas para los odos se administran generalmente durante 10 a [address] das. El tratamiento tambin puede incluir analgsicos o corticoides para reducir la comezn y la hinchazn. INSTRUCCIONES PARA EL CUIDADO EN EL HOGAR   Aplique gotas de antibitico en el conducto auditivo segn lo indicado por el mdico.  Tome los medicamentos solamente como se lo haya indicado el mdico.  Si tiene diabetes, siga las instrucciones adicionales de tratamiento que le haya dado el mdico.  OceanographerConcurra a todas las visitas de control como se lo haya indicado el mdico. PREVENCIN   Mantenga el odo seco. Use la punta de una toalla para absorber el agua del canal auditivo despus de nadar o del bao.  Evite rascarse o poner objetos en el interior del odo. Esto puede daar el conducto auditivo o eliminar la cera protectora que recubre el conducto.  Esto facilita el crecimiento de las bacterias y hongos.  Evite Progress Energynadar en los lagos, en agua contaminada, o en las piscinas mal cloradas.  Puede usar gotas para los odos hechas de alcohol y vinagre despus de nadar. Mezcle en partes iguales el vinagre blanco y el alcohol en una botella. Ponga 3 o 4 gotas en cada odo despus de nadar. SOLICITE ATENCIN MDICA SI:   Lance Mussiene fiebre.  Su odo contina rojo, hinchado, le duele o supura pus despus de 3 das.  El dolor, la hinchazn o el enrojecimiento empeoran.  Sufre un dolor intenso de Turkmenistancabeza.  Tiene en la zona detrs de la oreja roja, hinchada, le duele o est sensible. ASEGRESE DE QUE:   Comprende estas instrucciones.  Controlar su afeccin.  Recibir ayuda de inmediato si no mejora o si empeora.   Esta informacin no tiene Theme park managercomo fin reemplazar el consejo del mdico. Asegrese de hacerle al mdico cualquier pregunta que tenga.   Document Released: 11/12/2005 Document Revised: 12/03/2014 Elsevier Interactive Patient Education [employer]2016 Elsevier Inc.    IF you received an x-ray today, you will receive an invoice from [facility]. Please contact [facility] at [phone] with questions or concerns regarding your invoice.   IF you received labwork today, you will receive an invoice from [facility]. Please contact [facility] at [phone] with questions or concerns regarding your invoice.   Our billing staff will not be able to assist you with questions regarding bills from these companies.  You will be contacted  with the lab results as soon as they are available. The fastest way to get your results is to activate your My Chart account. Instructions are located on the last page of this paperwork. If you have not heard from us regarding the results in 2 weeks, please contact this office.      

## 2016-06-09 NOTE — Progress Notes (Signed)
    William Owens Lourdes SledgeDominguez Rosevear is a 35 y.o. male who presents to [facility] today for right ear pain. Patient notes a 2 day history of right ear pain. Pain is located at the base of the ear worse with your motion and pressure. He's tried some over-the-counter ear drops that helped a little. He denies any changes in hearing. Fevers chills nausea vomiting or diarrhea. No other medications tried yet. He notes he has to use earplugs for work and that seems to make the problem worse. He does not that he has been swimming recently.   Past Medical History  Diagnosis Date  . Diverticulitis   . Allergy    History reviewed. No pertinent past surgical history. Social History  Substance Use Topics  . Smoking status: Former Games developermoker  . Smokeless tobacco: Never Used  . Alcohol Use: 1.2 oz/week    2 Cans of beer per week     Comment: social   ROS as above Medications: Current Outpatient Prescriptions  Medication Sig Dispense Refill  . fluticasone (FLONASE) 50 MCG/ACT nasal spray USE TWO SPRAY(S) IN EACH NOSTRIL TWICE DAILY 16 g 2  . cetirizine (ZYRTEC) 10 MG tablet Take 1 tablet (10 mg total) by mouth daily. 30 tablet 11  . neomycin-polymyxin-hydrocortisone (CORTISPORIN) otic solution Place 3 drops into the right ear 4 (four) times daily. 10 mL 0   No current facility-administered medications for this visit.   No Known Allergies   Exam:  BP 100/68 mmHg  Pulse 77  Temp(Src) 98.7 F (37.1 C) (Oral)  Resp 12  Ht 5\' 7"  (1.702 m)  Wt 153 lb (69.4 kg)  BMI 23.96 kg/m2  SpO2 99% Gen: Well NAD HEENT: EOMI,  MMM External ears normal appearing bilaterally. The right ear canal is erythematous with scale at discharge. The tympanic membrane itself is nonbulging without effusion. The left external ear or canal and tympanic membranes are normal. The left ear is tender with motion. The mastoids are nontender bilaterally. Lungs: Normal work of breathing. CTABL Heart: RRR no MRG Abd: NABS, Soft.  Nondistended, Nontender Exts: Brisk capillary refill, warm and well perfused.   No results found for this or any previous visit (from the past 24 hour(s)). No results found.  Assessment and Plan: 35 y.o. male with otitis externa right ear. Treat with Cortisporin eardrops. Return as needed.  Discussed warning signs or symptoms. Please see discharge instructions. Patient expresses understanding.

## 2016-08-11 ENCOUNTER — Ambulatory Visit (INDEPENDENT_AMBULATORY_CARE_PROVIDER_SITE_OTHER): Payer: BLUE CROSS/BLUE SHIELD | Admitting: Osteopathic Medicine

## 2016-08-11 VITALS — BP 115/72 | HR 64 | Temp 98.5°F | Ht 67.4 in | Wt 151.2 lb

## 2016-08-11 DIAGNOSIS — Z0000 Encounter for general adult medical examination without abnormal findings: Secondary | ICD-10-CM | POA: Diagnosis not present

## 2016-08-11 DIAGNOSIS — J069 Acute upper respiratory infection, unspecified: Secondary | ICD-10-CM

## 2016-08-11 LAB — CBC WITH DIFFERENTIAL/PLATELET
BASOS PCT: 1 %
Basophils Absolute: 63 cells/uL (ref 0–200)
EOS PCT: 3 %
Eosinophils Absolute: 189 cells/uL (ref 15–500)
HCT: 46.3 % (ref 38.5–50.0)
HEMOGLOBIN: 15.7 g/dL (ref 13.2–17.1)
LYMPHS ABS: 2772 {cells}/uL (ref 850–3900)
Lymphocytes Relative: 44 %
MCH: 30.6 pg (ref 27.0–33.0)
MCHC: 33.9 g/dL (ref 32.0–36.0)
MCV: 90.3 fL (ref 80.0–100.0)
MONO ABS: 630 {cells}/uL (ref 200–950)
MPV: 10.6 fL (ref 7.5–12.5)
Monocytes Relative: 10 %
NEUTROS PCT: 42 %
Neutro Abs: 2646 cells/uL (ref 1500–7800)
Platelets: 219 10*3/uL (ref 140–400)
RBC: 5.13 MIL/uL (ref 4.20–5.80)
RDW: 13.7 % (ref 11.0–15.0)
WBC: 6.3 10*3/uL (ref 3.8–10.8)

## 2016-08-11 LAB — COMPLETE METABOLIC PANEL WITH GFR
ALT: 14 U/L (ref 9–46)
AST: 20 U/L (ref 10–40)
Albumin: 4.9 g/dL (ref 3.6–5.1)
Alkaline Phosphatase: 83 U/L (ref 40–115)
BUN: 16 mg/dL (ref 7–25)
CHLORIDE: 103 mmol/L (ref 98–110)
CO2: 25 mmol/L (ref 20–31)
CREATININE: 0.79 mg/dL (ref 0.60–1.35)
Calcium: 9.9 mg/dL (ref 8.6–10.3)
GFR, Est African American: >89 mL/min (ref >=60)
GFR, Est Non African American: >89 mL/min (ref >=60)
GLUCOSE: 98 mg/dL (ref 65–99)
POTASSIUM: 4.5 mmol/L (ref 3.5–5.3)
SODIUM: 139 mmol/L (ref 135–146)
Total Bilirubin: 0.4 mg/dL (ref 0.2–1.2)
Total Protein: 7.8 g/dL (ref 6.1–8.1)

## 2016-08-11 LAB — LIPID PANEL
CHOLESTEROL: 155 mg/dL (ref 125–200)
HDL: 57 mg/dL (ref >=40)
LDL Cholesterol: 82 mg/dL (ref <130)
Total CHOL/HDL Ratio: 2.7 Ratio (ref <=5.0)
Triglycerides: 81 mg/dL (ref <150)
VLDL: 16 mg/dL (ref <30)

## 2016-08-11 LAB — HIV ANTIBODY (ROUTINE TESTING W REFLEX): HIV: NONREACTIVE

## 2016-08-11 MED ORDER — IPRATROPIUM BROMIDE 0.03 % NA SOLN: 2.0000 | Freq: Three times a day (TID) | NASAL | 0 refills | Status: DC | PRN

## 2016-08-11 NOTE — Patient Instructions (Addendum)
Cuando se siente mejor, regregsa a la clinica para sus vacunas. Llammanos si hay preguntas.  Ibuprofen 400-600 mg, Tylenol 500 mg, Benadryl 25-50 mg every 4-6 hours Sudafed 10 mg every 12 hours or every 4 hours (see instructions) Cepacol lozenge as needed

## 2016-08-11 NOTE — Progress Notes (Signed)
HPI: William Owens is a 35 y.o. Hispanic or Latino male  who presents to [facility] today, 08/11/16,  for chief complaint of:  Chief Complaint  Patient presents with  . Annual Exam    has form to fill out    Viral Cold . Location/Quality: sinus congestion and ear fullness bilateral . Duration: 3 days . Modifying factors: OTC meds not helping . Assoc signs/symptoms: no fever/chill  Here for physical - needs form filed out for work/insurance   Interpreter assistance used   Past medical, surgical, social and family history reviewed: Past Medical History:  Diagnosis Date  . Allergy   . Diverticulitis    No past surgical history on file. Social History  Substance Use Topics  . Smoking status: Former Games developermoker  . Smokeless tobacco: Never Used  . Alcohol use 1.2 oz/week    2 Cans of beer per week     Comment: social   No family history on file.   Current medication list and allergy/intolerance information reviewed:   Current Outpatient Prescriptions  Medication Sig Dispense Refill  . cetirizine (ZYRTEC) 10 MG tablet Take 1 tablet (10 mg total) by mouth daily. 30 tablet 11  . fluticasone (FLONASE) 50 MCG/ACT nasal spray USE TWO SPRAY(S) IN EACH NOSTRIL TWICE DAILY 16 g 2   No current facility-administered medications for this visit.    No Known Allergies    Review of Systems:  Constitutional:  No  fever, no chills, +recent illness, No unintentional weight changes. No significant fatigue.   HEENT: No  headache, no vision change, no hearing change, + sore throat, +sinus pressure  Cardiac: No  chest pain, No  pressure,  Respiratory:  No  shortness of breath. +Cough  Gastrointestinal: No  abdominal pain, No  nausea, No  vomiting, No  diarrhea   Musculoskeletal: No new myalgia/arthralgia  Skin: No  Rash  Psychiatric: No  concerns with depression, No  concerns with anxiety,  Exam:  BP 115/72 (BP Location: Right Arm, Patient  Position: Sitting, Cuff Size: Normal)   Pulse 64   Temp 98.5 F (36.9 C) (Oral)   Ht 5' 7.4" (1.712 m)   Wt 151 lb 3.2 oz (68.6 kg)   BMI 23.40 kg/m   Constitutional: VS see above. General Appearance: alert, well-developed, well-nourished, NAD  Eyes: Normal lids and conjunctive, non-icteric sclera  Ears, Nose, Mouth, Throat: MMM, Normal external inspection ears/nares/mouth/lips/gums. TM normal bilaterally. Pharynx/tonsils no erythema, no exudate. Nasal mucosa normal.   Neck: No masses, trachea midline. No thyroid enlargement. No tenderness/mass appreciated. No lymphadenopathy  Respiratory: Normal respiratory effort. no wheeze, no rhonchi, no rales  Cardiovascular: S1/S2 normal, no murmur, no rub/gallop auscultated. RRR. No lower extremity edema.   Gastrointestinal: Nontender, no masses.   Musculoskeletal: Gait normal. No clubbing/cyanosis of digits.   Neurological:Normal balance/coordination. No tremor.   Skin: warm, dry, intact. No rash/ulcer. No concerning nevi or subq nodules on limited exam.    Psychiatric: Normal judgment/insight. Normal mood and affect. Oriented x3.     ASSESSMENT/PLAN:    Annual physical exam - Plan: CBC with Differential/Platelet, COMPLETE METABOLIC PANEL WITH GFR, HIV antibody, Lipid panel  Viral URI - Plan: ipratropium (ATROVENT) 0.03 % nasal spray   Patient Instructions  Cuando se siente mejor, regregsa a la clinica para sus vacunas. Llammanos si hay preguntas.  Ibuprofen 400-600 mg, Tylenol 500 mg, Benadryl 25-50 mg every 4-6 hours Sudafed 10 mg every 12 hours or every 4 hours (see instructions)  Cepacol lozenge as needed  Form filled out - confirming pt had physical exam today   MALE PREVENTIVE CARE updated 08/11/16   ANNUAL SCREENING/COUNSELING  Any changes to health in the past year? no  Tobacco - no   Alcohol - none  Diet/Exercise - Healthy habits discussed to decrease CV risk and promote overall health. Patient does not have  dietary restrictions. 2- 3 times per week exercises, and follows low-fat diet    Depression - PQH2 Negative  Feel safe at home? - yes  HTN SCREENING - SEE VITALS  SEXUAL/REPRODUCTIVE HEALTH  Sexually active? - Yes with male.  STI testing needed/desired today? - no  INFECTIOUS DISEASE SCREENING  HIV - needs  GC/CT - does not need  HepC - does not need  TB - does not need  CANCER SCREENING  Lung - does not need  Colon - does not need  Prostate - does not need  OTHER DISEASE SCREENING  Lipid - needs  DM2 - needs  Osteoporosis - does not need  ADULT VACCINATION  Influenza - needs today, annual vaccine recommended.   Td - needs but feeling ill today, will hold off on this.   Zoster - was not indicated  Pneumonia - was not indicated     Visit summary with medication list and pertinent instructions was printed for patient to review. All questions at time of visit were answered - patient instructed to contact office with any additional concerns. ER/RTC precautions were reviewed with the patient. Follow-up plan: Return in about 1 year (around 08/11/2017), or sooner if needed, for ANNUAL PHYSICAL .

## 2016-09-01 ENCOUNTER — Telehealth: Payer: Self-pay | Admitting: *Deleted

## 2016-09-01 NOTE — Telephone Encounter (Signed)
Patients wife notified if lab results from 08/11/16.  Patient phone is not working.

## 2016-10-27 ENCOUNTER — Ambulatory Visit (INDEPENDENT_AMBULATORY_CARE_PROVIDER_SITE_OTHER): Payer: BLUE CROSS/BLUE SHIELD | Admitting: Emergency Medicine

## 2016-10-27 VITALS — BP 112/70 | HR 66 | Temp 98.3°F | Resp 16 | Ht 67.4 in | Wt 153.0 lb

## 2016-10-27 DIAGNOSIS — J029 Acute pharyngitis, unspecified: Secondary | ICD-10-CM

## 2016-10-27 DIAGNOSIS — J301 Allergic rhinitis due to pollen: Secondary | ICD-10-CM | POA: Diagnosis not present

## 2016-10-27 LAB — POCT RAPID STREP A (OFFICE): Rapid Strep A Screen: NEGATIVE

## 2016-10-27 MED ORDER — FLUTICASONE PROPIONATE 50 MCG/ACT NA SUSP: NASAL | 2 refills | Status: DC

## 2016-10-27 MED ORDER — FIRST-DUKES MOUTHWASH MT SUSP: OROMUCOSAL | 1 refills | Status: DC

## 2016-10-27 MED ORDER — CETIRIZINE HCL 10 MG PO TABS: 10.0000 mg | ORAL_TABLET | Freq: Every day | ORAL | 11 refills | Status: DC

## 2016-10-27 NOTE — Progress Notes (Signed)
By signing my name below, I, [name], attest that this documentation has been prepared under the direction and in the presence of Lesle ChrisSteven Humberto Addo, MD.  Electronically Signed: Andrew Auaven Small, ED Scribe. 10/27/2016. 1:39 PM.  Chief Complaint:  Chief Complaint  Patient presents with  . Sore Throat    x 3 days     HPI: William Owens is a 35 y.o. male who [facility] today complaining of a sore throat onset 3 days. Pt has noticed redness and a white/clear liquid in back of throat. Pt reports associated chills, sweats, and a mild cough. No at home treatments tried.  He reports sick contacts at home of his son who had fever and cough. He denies fevers.   A spanish interpreter was used to capture the hx. [name]- [id number]  Past Medical History:  Diagnosis Date  . Allergy   . Diverticulitis    No past surgical history on file. Social History   Social History  . Marital status: Married    Spouse name: N/A  . Number of children: N/A  . Years of education: N/A   Social History Main Topics  . Smoking status: Former Games developermoker  . Smokeless tobacco: Never Used  . Alcohol use 1.2 oz/week    2 Cans of beer per week     Comment: social  . Drug use: No  . Sexual activity: Not Asked   Other Topics Concern  . None   Social History Narrative  . None   No family history on file. No Known Allergies Prior to Admission medications   Medication Sig Start Date End Date Taking? Authorizing Provider  fluticasone (FLONASE) 50 MCG/ACT nasal spray USE TWO SPRAY(S) IN EACH NOSTRIL TWICE DAILY 02/19/16  Yes Dorna LeitzNicole V Bush, PA-C  ipratropium (ATROVENT) 0.03 % nasal spray Place 2 sprays into both nostrils 3 (three) times daily as needed for rhinitis. 08/11/16  Yes Sunnie NielsenNatalie Alexander, DO  cetirizine (ZYRTEC) 10 MG tablet Take 1 tablet (10 mg total) by mouth daily. Patient not taking: Reported on 10/27/2016 01/23/15   Dorna LeitzNicole V Bush, PA-C     ROS: The patient denies fevers, night sweats,  unintentional weight loss, chest pain, palpitations, wheezing, dyspnea on exertion, nausea, vomiting, abdominal pain, dysuria, hematuria, melena, numbness, weakness, or tingling.   All other systems have been reviewed and were otherwise negative with the exception of those mentioned in the HPI and as above.    PHYSICAL EXAM: Vitals:   10/27/16 1337  BP: 112/70  Pulse: 66  Resp: 16  Temp: 98.3 F (36.8 C)   Body mass index is 23.68 kg/m.   General: Alert, no acute distress HEENT:  Normocephalic, atraumatic, oropharynx patent. Eye: [facility]I, [facility] Cardiovascular:  Regular rate and rhythm, no rubs murmurs or gallops.  No Carotid bruits, radial pulse intact. No pedal edema.  Respiratory: Clear to auscultation bilaterally.  No wheezes, rales, or rhonchi.  No cyanosis, no use of accessory musculature Abdominal: No organomegaly, abdomen is soft and non-tender, positive bowel sounds.  No masses. Musculoskeletal: Gait intact. No edema, tenderness Skin: No rashes. Neurologic: Facial musculature symmetric. Psychiatric: Patient acts appropriately throughout our interaction. Lymphatic: No cervical or submandibular lymphadenopathy   LABS: Results for orders placed or performed in visit on 10/27/16  POCT rapid strep A  Result Value Ref Range   Rapid Strep A Screen Negative Negative    ASSESSMENT/PLAN: Strep test negative. Throat culture was sent. Will treat as allergic rhinitis with Zyrtec, Dukes mouthwash, and  Flonase.I personally performed the services described in this documentation, which was scribed in my presence. The recorded information has been reviewed and is accurate.   Gross sideeffects, risk and benefits, and alternatives of medications d/w patient. Patient is aware that all medications have potential sideeffects and we are unable to predict every sideeffect or drug-drug interaction that may occur.  Lesle ChrisSteven Keevan Wolz MD 10/27/2016 1:39 PM

## 2016-10-27 NOTE — Patient Instructions (Addendum)
I will call you with culture results. He will take Zyrtec one a day. You have a mouthwash to use to gargle with. You also have a nasal spray to use. Alergias nasales (Nasal Allergies) Las alergias nasales son Neomia Dearuna reaccin a los alrgenos que se encuentran en el aire. Los alrgenos son las partculas minsculas que estn en el aire y que hacen que el cuerpo tenga una reaccin Counselling psychologistalrgica. Las [employer]alergias nasales no se transmiten de Neomia Dearuna persona a otra (contagiosas). Este trastorno no puede curarse Tree surgeonpero puede controlarse. Algunas de las causas ms frecuentes de las Osbornburyalergias nasales son las siguientes:  El polen que proviene de los rboles, el pasto y las Danielsmalezas.  Los caros del Building surveyorpolvo en el hogar.  La caspa de las Lyndonmascotas.  Moho. CUIDADOS EN EL HOGAR  En lo posible, evite los alrgenos que causan los sntomas.  Mantenga las ventanas cerradas. Si es posible, use aire acondicionado cuando hay mucho polen en el aire.  No use ventiladores en su hogar.  No cuelgue ropa en el exterior para que se seque.  Use gafas para el sol para mantener el polen alejado de los ojos.  Lvese las manos enseguida despus de tocar a las Avnetmascotas domsticas.  Tome los medicamentos de venta libre y los recetados solamente como se lo haya indicado el mdico.  OceanographerConcurra a todas las visitas de control como se lo haya indicado el mdico. Esto es importante. SOLICITE AYUDA SI:  Tiene fiebre.  Tiene tos que no desaparece (es persistente).  Comienza a emitir un sonido agudo al respirar (sibilancias).  Los sntomas no mejoran con Scientist, research (medical)el tratamiento.  Le sale lquido espeso por la Clinical cytogeneticistnariz.  Comienza a tener hemorragia nasal. SOLICITE AYUDA DE INMEDIATO SI:  Tiene la boca o los labios hinchados.  Tiene dificultad para respirar.  Se siente mareado o como si se fuera a desmayar.  Tiene transpiracin fra. Esta informacin no tiene Theme park managercomo fin reemplazar el consejo del mdico. Asegrese de hacerle al mdico cualquier  pregunta que tenga. Document Released: 04/16/2011 Document Revised: 03/05/2016 Document Reviewed: 05/25/2015 Elsevier Interactive Patient Education  2017 [employer]     IF you received an x-ray today, you will receive an invoice from [facility]. Please contact [facility] at [phone] with questions or concerns regarding your invoice.   IF you received labwork today, you will receive an invoice from [facility]. Please contact [facility] at [phone] with questions or concerns regarding your invoice.   Our billing staff will not be able to assist you with questions regarding bills from these companies.  You will be contacted with the lab results as soon as they are available. The fastest way to get your results is to activate your My Chart account. Instructions are located on the last page of this paperwork. If you have not heard from us regarding the results in 2 weeks, please contact this office.

## 2016-10-28 LAB — CULTURE, GROUP A STREP: Organism ID, Bacteria: NORMAL

## 2016-12-07 ENCOUNTER — Ambulatory Visit (INDEPENDENT_AMBULATORY_CARE_PROVIDER_SITE_OTHER): Payer: BLUE CROSS/BLUE SHIELD | Admitting: Physician Assistant

## 2016-12-07 VITALS — BP 120/64 | HR 60 | Resp 14 | Ht 66.5 in | Wt 145.0 lb

## 2016-12-07 DIAGNOSIS — M722 Plantar fascial fibromatosis: Secondary | ICD-10-CM

## 2016-12-07 DIAGNOSIS — Z8719 Personal history of other diseases of the digestive system: Secondary | ICD-10-CM | POA: Diagnosis not present

## 2016-12-07 MED ORDER — CELECOXIB 200 MG PO CAPS: 200.0000 mg | ORAL_CAPSULE | Freq: Two times a day (BID) | ORAL | 0 refills | Status: DC

## 2016-12-07 NOTE — Progress Notes (Signed)
  12/07/2016 4:52 PM   DOB: [birth year] / MRN: [id number]  SUBJECTIVE:  William Owens is a 36 y.o. male presenting for pain in feet that started 6 days ago. REports the pain is in on the bottom of the left worse than right but both hurt.  Denies the first step of the day being his worst pain. Walking does make the pain better, and standing for prolonged periods makes it worse.   He has No Known Allergies.   He  has a past medical history of Allergy and Diverticulitis.    He  reports that he has quit smoking. He has never used smokeless tobacco. He reports that he drinks about 1.2 oz of alcohol per week . He reports that he does not use drugs. He  has no sexual activity history on file. The patient  has no past surgical history on file.  His family history is not on file.  Review of Systems  Musculoskeletal: Positive for myalgias. Negative for falls and joint pain.    The problem list and medications were reviewed and updated by myself where necessary and exist elsewhere in the encounter.   OBJECTIVE:  BP 120/64   Pulse 60   Resp 14   Ht 5' 6.5" (1.689 m)   Wt 145 lb (65.8 kg)   SpO2 100%   BMI 23.05 kg/m   Physical Exam  Cardiovascular: Normal rate and regular rhythm.   Pulmonary/Chest: Effort normal and breath sounds normal.  Musculoskeletal: Normal range of motion. He exhibits tenderness (Plantar facial tenderness bilaterally. Pain with forced dorsi flexion. ). He exhibits no edema or deformity.  Vitals reviewed.   No results found for this or any previous visit (from the past 72 hour(s)).  No results found.  ASSESSMENT AND PLAN:  Terron was seen today for foot pain.  Diagnoses and all orders for this visit:  Plantar fasciitis Comments: RTC in three weeks for re-eval if not better.  Advised he avoid any work activies that may exacerbate the condition.  Orders: -     celecoxib (CELEBREX) 200 MG capsule; Take 1 capsule (200 mg total) by mouth 2 (two)  times daily.  History of gastroesophageal reflux (GERD): Starting celecoxib given history of GERD.     The patient is advised to call or return to clinic if he does not see an improvement in symptoms, or to seek the care of the closest emergency department if he worsens with the above plan.   Deliah BostonMichael Gerhardt Gleed, MHS, PA-C [facility] 12/07/2016 4:52 PM

## 2016-12-07 NOTE — Patient Instructions (Signed)
     IF you received an x-ray today, you will receive an invoice from [facility]. Please contact [facility] at [phone] with questions or concerns regarding your invoice.   IF you received labwork today, you will receive an invoice from [facility]. Please contact [facility] at [phone] with questions or concerns regarding your invoice.   Our billing staff will not be able to assist you with questions regarding bills from these companies.  You will be contacted with the lab results as soon as they are available. The fastest way to get your results is to activate your My Chart account. Instructions are located on the last page of this paperwork. If you have not heard from us regarding the results in 2 weeks, please contact this office.     

## 2017-01-12 ENCOUNTER — Other Ambulatory Visit: Payer: Self-pay

## 2017-01-12 DIAGNOSIS — M722 Plantar fascial fibromatosis: Secondary | ICD-10-CM

## 2017-01-12 NOTE — Telephone Encounter (Signed)
Patient needs his celecoxib (CELEBREX) 200 MG capsule refilled, [facility] uses the [facility] on [address].

## 2017-01-12 NOTE — Telephone Encounter (Signed)
12/07/16 last ov and refill 

## 2017-01-14 MED ORDER — CELECOXIB 200 MG PO CAPS: 200.0000 mg | ORAL_CAPSULE | Freq: Two times a day (BID) | ORAL | 0 refills | Status: DC

## 2017-02-05 ENCOUNTER — Ambulatory Visit (INDEPENDENT_AMBULATORY_CARE_PROVIDER_SITE_OTHER): Payer: BLUE CROSS/BLUE SHIELD | Admitting: Physician Assistant

## 2017-02-05 VITALS — BP 132/78 | HR 100 | Temp 99.7°F | Resp 18 | Ht 66.5 in | Wt 151.0 lb

## 2017-02-05 DIAGNOSIS — J029 Acute pharyngitis, unspecified: Secondary | ICD-10-CM

## 2017-02-05 DIAGNOSIS — J358 Other chronic diseases of tonsils and adenoids: Secondary | ICD-10-CM

## 2017-02-05 LAB — POCT RAPID STREP A (OFFICE): RAPID STREP A SCREEN: POSITIVE — AB

## 2017-02-05 MED ORDER — LIDOCAINE VISCOUS 2 % MT SOLN
5.0000 mL | Freq: Once | OROMUCOSAL | Status: AC
  Administered 2017-02-05: 5 mL via OROMUCOSAL

## 2017-02-05 MED ORDER — AMOXICILLIN 875 MG PO TABS: 875.0000 mg | ORAL_TABLET | Freq: Two times a day (BID) | ORAL | 0 refills | Status: DC

## 2017-02-05 NOTE — Patient Instructions (Signed)
     IF you received an x-ray today, you will receive an invoice from [facility]. Please contact [facility] at [phone] with questions or concerns regarding your invoice.   IF you received labwork today, you will receive an invoice from [facility]. Please contact [facility] at [phone] with questions or concerns regarding your invoice.   Our billing staff will not be able to assist you with questions regarding bills from these companies.  You will be contacted with the lab results as soon as they are available. The fastest way to get your results is to activate your My Chart account. Instructions are located on the last page of this paperwork. If you have not heard from us regarding the results in 2 weeks, please contact this office.     

## 2017-02-05 NOTE — Progress Notes (Signed)
02/05/2017 4:53 PM   DOB: 1981/03/10 / MRN: [id number]  SUBJECTIVE:  William Owens is a 36 y.o. male presenting for sore throat that started yesterday. Associates low grade fever and chills.  He denies cough, nasal congestion, eye irritation.  Has been taking benadryl without relief of the pain. He has a history of group A strep pharyngitis.   He has No Known Allergies.   He  has a past medical history of Allergy and Diverticulitis.    He  reports that he has quit smoking. He has never used smokeless tobacco. He reports that he drinks about 1.2 oz of alcohol per week . He reports that he does not use drugs. He  has no sexual activity history on file. The patient  has no past surgical history on file.  His family history is not on file.  Review of Systems  Constitutional: Positive for chills and malaise/fatigue. Negative for diaphoresis.  HENT: Negative for congestion, hearing loss, nosebleeds and sinus pain.   Respiratory: Negative for cough.   Gastrointestinal: Negative for heartburn.    The problem list and medications were reviewed and updated by myself where necessary and exist elsewhere in the encounter.   OBJECTIVE:  BP 132/78 (BP Location: Right Arm, Patient Position: Sitting, Cuff Size: Small)   Pulse 100   Temp 99.7 F (37.6 C) (Oral)   Resp 18   Ht 5' 6.5" (1.689 m)   Wt 151 lb (68.5 kg)   SpO2 97%   BMI 24.01 kg/m   Physical Exam  HENT:  Right Ear: Tympanic membrane normal.  Left Ear: Tympanic membrane normal.  Nose: Nose normal.  Mouth/Throat: Uvula is midline and mucous membranes are normal. No uvula swelling. Oropharyngeal exudate and posterior oropharyngeal erythema present. No posterior oropharyngeal edema or tonsillar abscesses.  Cardiovascular: Regular rhythm, normal heart sounds and intact distal pulses.   Pulmonary/Chest: Effort normal.  Lymphadenopathy:       Head (right side): Tonsillar adenopathy present. No submandibular, no  preauricular, no posterior auricular and no occipital adenopathy present.       Head (left side): Tonsillar adenopathy present. No submandibular, no preauricular, no posterior auricular and no occipital adenopathy present.    Results for orders placed or performed in visit on 02/05/17 (from the past 72 hour(s))  POCT rapid strep A     Status: Abnormal   Collection Time: 02/05/17  4:47 PM  Result Value Ref Range   Rapid Strep A Screen Positive (A) Negative    No results found.  ASSESSMENT AND PLAN:  William Owens was seen today for sore throat and ear pain.  Diagnoses and all orders for this visit:  Sore throat: He has features of strep and has a history of group A infection.  I will treat him for that etiology today regardless of his rapid today.   -     lidocaine (XYLOCAINE) 2 % viscous mouth solution 5 mL; Use as directed 5 mLs in the mouth or throat once.  Tonsillar exudate -     Culture, Group A Strep -     POCT rapid strep A -     amoxicillin (AMOXIL) 875 MG tablet; Take 1 tablet (875 mg total) by mouth 2 (two) times daily.    The patient is advised to call or return to clinic if he does not see an improvement in symptoms, or to seek the care of the closest emergency department if he worsens with the above plan.   Deliah Boston, MHS,  PA-C [facility] 02/05/2017 4:53 PM

## 2017-02-07 LAB — CULTURE, GROUP A STREP: STREP A CULTURE: POSITIVE — AB

## 2017-04-29 ENCOUNTER — Encounter: Payer: Self-pay | Admitting: Emergency Medicine

## 2017-04-29 ENCOUNTER — Ambulatory Visit (INDEPENDENT_AMBULATORY_CARE_PROVIDER_SITE_OTHER): Payer: BLUE CROSS/BLUE SHIELD | Admitting: Emergency Medicine

## 2017-04-29 VITALS — BP 122/70 | HR 63 | Temp 98.1°F | Resp 18 | Ht 66.38 in | Wt 142.8 lb

## 2017-04-29 DIAGNOSIS — J302 Other seasonal allergic rhinitis: Secondary | ICD-10-CM | POA: Diagnosis not present

## 2017-04-29 MED ORDER — FLUTICASONE PROPIONATE 50 MCG/ACT NA SUSP: 2.0000 | Freq: Two times a day (BID) | NASAL | 3 refills | Status: DC

## 2017-04-29 NOTE — Patient Instructions (Addendum)
   IF you received an x-ray today, you will receive an invoice from [facility]. Please contact [facility] at [phone] with questions or concerns regarding your invoice.   IF you received labwork today, you will receive an invoice from [facility]. Please contact [facility] at [phone] with questions or concerns regarding your invoice.   Our billing staff will not be able to assist you with questions regarding bills from these companies.  You will be contacted with the lab results as soon as they are available. The fastest way to get your results is to activate your My Chart account. Instructions are located on the last page of this paperwork. If you have not heard from us regarding the results in 2 weeks, please contact this office.     Rinitis alrgica (Allergic Rhinitis) La rinitis alrgica ocurre cuando las membranas mucosas de la nariz responden a los alrgenos. Los alrgenos son las partculas que estn en el aire y que hacen que el cuerpo tenga una reaccin alrgica. Esto hace que usted libere anticuerpos alrgicos. A travs de una cadena de eventos, estos finalmente hacen que usted libere histamina en la corriente sangunea. Aunque la funcin de la histamina es proteger al organismo, es esta liberacin de histamina lo que provoca malestar, como los estornudos frecuentes, la congestin y goteo y picazn nasales. CAUSAS La causa de la rinitis alrgica estacional (fiebre del heno) son los alrgenos del polen que pueden provenir del csped, los rboles y la maleza. La causa de la rinitis alrgica permanente (rinitis alrgica perenne) son los alrgenos, como los caros del polvo domstico, la caspa de las mascotas y las esporas del moho. SNTOMAS  Secrecin nasal (congestin).  Goteo y picazn nasales con estornudos y lagrimeo. DIAGNSTICO Su mdico puede ayudarlo a determinar el alrgeno o los alrgenos que desencadenan sus sntomas. Si usted y su mdico no pueden  determinar cul es el alrgeno, pueden hacerse anlisis de sangre o estudios de la piel. El mdico diagnosticar la afeccin despus de hacerle una historia clnica y un examen fsico. Adems, puede evaluarlo para detectar la presencia de otras enfermedades afines, como asma, conjuntivitis u otitis. TRATAMIENTO La rinitis alrgica no tiene cura, pero puede controlarse con lo siguiente:  Medicamentos que inhiben los sntomas de alergia, por ejemplo, vacunas contra la alergia, aerosoles nasales y antihistamnicos por va oral.  Evitar el alrgeno. La fiebre del heno a menudo puede tratarse con antihistamnicos en las formas de pldoras o aerosol nasal. Los antihistamnicos bloquean los efectos de la histamina. Existen medicamentos de venta libre que pueden ayudar con la congestin nasal y la hinchazn alrededor de los ojos. Consulte a su mdico antes de tomar o administrarse este medicamento. Si la prevencin del alrgeno o el medicamento recetado no dan resultado, existen muchos medicamentos nuevos que su mdico puede recetarle. Pueden usarse medicamentos ms fuertes si las medidas iniciales no son efectivas. Pueden aplicarse inyecciones desensibilizantes si los medicamentos y la prevencin no funcionan. La desensibilizacin ocurre cuando un paciente recibe vacunas constantes hasta que el cuerpo se vuelve menos sensible al alrgeno. Asegrese de realizar un seguimiento con su mdico si los problemas continan. INSTRUCCIONES PARA EL CUIDADO EN EL HOGAR No es posible evitar por completo los alrgenos, pero puede reducir los sntomas al tomar medidas para limitar su exposicin a ellos. Es muy til saber exactamente a qu es alrgico para que pueda evitar sus desencadenantes especficos. SOLICITE ATENCIN MDICA SI:  Tiene fiebre.  Desarrolla una tos que no cesa fcilmente (persistente).    Le falta el aire.  Comienza a tener sibilancias.  Los sntomas interfieren con las actividades diarias  normales. Esta informacin no tiene como fin reemplazar el consejo del mdico. Asegrese de hacerle al mdico cualquier pregunta que tenga. Document Released: 08/22/2005 Document Revised: 12/03/2014 Document Reviewed: 07/20/2013 Elsevier Interactive Patient Education  2017 Elsevier Inc.  

## 2017-04-29 NOTE — Progress Notes (Signed)
William Owens 36 y.o.   Chief Complaint  Patient presents with  . Medication Refill    fluticasone    HISTORY OF PRESENT ILLNESS: This is a 36 y.o. male complaining of seasonal allergies; needs refill of nasal spray.  HPI   Prior to Admission medications   Medication Sig Start Date End Date Taking? Authorizing Provider  celecoxib (CELEBREX) 200 MG capsule Take 1 capsule (200 mg total) by mouth 2 (two) times daily. 01/14/17  Yes Ofilia Neas, PA-C  cetirizine (ZYRTEC) 10 MG tablet Take 1 tablet (10 mg total) by mouth daily. 10/27/16  Yes Collene Gobble, MD  fluticasone (FLONASE) 50 MCG/ACT nasal spray Place 2 sprays into both nostrils 2 (two) times daily at 10 AM and 5 PM.   Yes [provider]  amoxicillin (AMOXIL) 875 MG tablet Take 1 tablet (875 mg total) by mouth 2 (two) times daily. Patient not taking: Reported on 04/29/2017 02/05/17   Ofilia Neas, PA-C    No Known Allergies  Patient Active Problem List   Diagnosis Date Noted  . Annual physical exam 08/11/2016  . Rhinitis, allergic 01/23/2015  . FATIGUE 11/24/2010  . GERD 05/13/2008  . CONSTIPATION 05/13/2008    Past Medical History:  Diagnosis Date  . Allergy   . Diverticulitis     History reviewed. No pertinent surgical history.  Social History   Social History  . Marital status: Married    Spouse name: N/A  . Number of children: N/A  . Years of education: N/A   Occupational History  . Not on file.   Social History Main Topics  . Smoking status: Former Games developer  . Smokeless tobacco: Never Used  . Alcohol use 1.2 oz/week    2 Cans of beer per week     Comment: social  . Drug use: No  . Sexual activity: Not on file   Other Topics Concern  . Not on file   Social History Narrative  . No narrative on file    History reviewed. No pertinent family history.   Review of Systems  Constitutional: Positive for weight loss. Negative for chills, fever and malaise/fatigue.   HENT: Positive for congestion. Negative for ear pain and sore throat.   Eyes: Negative for blurred vision, double vision, discharge and redness.  Respiratory: Negative for cough, shortness of breath and wheezing.   Cardiovascular: Negative for chest pain and palpitations.  Gastrointestinal: Negative for abdominal pain, diarrhea, nausea and vomiting.  Genitourinary: Negative for dysuria and hematuria.  Musculoskeletal: Negative for back pain, myalgias and neck pain.  Skin: Negative.  Negative for rash.  Neurological: Negative for dizziness and headaches.  Endo/Heme/Allergies: Negative.   All other systems reviewed and are negative.  Vitals:   04/29/17 1722  BP: 122/70  Pulse: 63  Resp: 18  Temp: 98.1 F (36.7 C)    Physical Exam  Constitutional: He is oriented to person, place, and time. He appears well-developed and well-nourished.  HENT:  Head: Normocephalic and atraumatic.  Right Ear: External ear normal.  Left Ear: External ear normal.  Nose: Nose normal.  Mouth/Throat: Oropharynx is clear and moist. No oropharyngeal exudate.  Eyes: Conjunctivae and EOM are normal. Pupils are equal, round, and reactive to light.  Neck: Normal range of motion. No JVD present. No thyromegaly present.  Cardiovascular: Normal rate, regular rhythm and normal heart sounds.   Pulmonary/Chest: Effort normal and breath sounds normal.  Abdominal: Soft. Bowel sounds are normal. He exhibits no distension. There  is no tenderness.  Musculoskeletal: Normal range of motion.  Lymphadenopathy:    He has no cervical adenopathy.  Neurological: He is alert and oriented to person, place, and time. No sensory deficit. He exhibits normal muscle tone.  Skin: Skin is warm and dry. Capillary refill takes less than 2 seconds. No rash noted.  Psychiatric: He has a normal mood and affect. His behavior is normal.  Vitals reviewed.    ASSESSMENT & PLAN: William Owens was seen Owens for medication refill.  Diagnoses  and all orders for this visit:  Seasonal allergic rhinitis, unspecified trigger  Other orders -     fluticasone (FLONASE) 50 MCG/ACT nasal spray; Place 2 sprays into both nostrils 2 (two) times daily at 10 AM and 5 PM.   Patient Instructions       William Owens, you will receive an invoice from William Owens. Please contact William Owens at [phone] with questions or concerns regarding your invoice.   William you received labwork Owens, you will receive an invoice from William Owens. Please contact William Owens at [phone] with questions or concerns regarding your invoice.   Our billing staff will not be able to assist you with questions regarding bills from these companies.  You will be contacted with the lab results as soon as they are available. The fastest way to get your results is to activate your William Owens account. Instructions are located on the last page of this paperwork. William you have not heard from us regarding the results in 2 weeks, please contact this office.     Rinitis alrgica (Allergic Rhinitis) La rinitis alrgica ocurre cuando las membranas mucosas de la nariz responden a los alrgenos. Los alrgenos son las partculas que estn en el aire y que hacen que el cuerpo tenga una reaccin Counselling psychologistalrgica. Esto hace que usted libere anticuerpos alrgicos. A travs de una cadena de eventos, estos finalmente hacen que usted libere histamina en la corriente sangunea. Aunque la funcin de la histamina es proteger al organismo, es esta liberacin de histamina lo que provoca malestar, como los estornudos frecuentes, la congestin y goteo y Control and instrumentation engineerpicazn nasales. CAUSAS La causa de la rinitis Merchandiser, retailalrgica estacional (fiebre del heno) son los alrgenos del polen que pueden provenir del csped, los rboles y Theme park managerla maleza. La causa de la rinitis IT consultantalrgica permanente (rinitis alrgica perenne) son los alrgenos, como los caros del polvo domstico, la caspa de las mascotas y las  esporas del moho. SNTOMAS  Secrecin nasal (congestin).  Goteo y picazn nasales con estornudos y Arboriculturistlagrimeo.  DIAGNSTICO Su mdico puede ayudarlo a Warehouse managerdeterminar el alrgeno o los alrgenos que desencadenan sus sntomas. Si usted y su mdico no pueden Chief Strategy Officerdeterminar cul es el alrgeno, pueden hacerse anlisis de sangre o estudios de la piel. El mdico diagnosticar la afeccin despus de hacerle una historia clnica y un examen fsico. Adems, puede evaluarlo para detectar la presencia de otras enfermedades afines, como asma, conjuntivitis u otitis. TRATAMIENTO La rinitis alrgica no tiene Arubacura, pero puede controlarse con lo siguiente:  Medicamentos que CSX Corporationinhiben los sntomas de Lost Nationalergia, por ejemplo, vacunas contra la Agricolaalergia, aerosoles nasales y antihistamnicos por va oral.  Evitar el alrgeno. La fiebre del heno a menudo puede tratarse con antihistamnicos en las formas de pldoras o aerosol nasal. Los antihistamnicos bloquean los efectos de la histamina. Existen medicamentos de venta libre que pueden ayudar con la congestin nasal y la hinchazn alrededor de los ojos. Consulte a su mdico antes de tomar o administrarse este medicamento.  Si la prevencin del alrgeno o el medicamento recetado no dan resultado, existen muchos medicamentos nuevos que su mdico puede recetarle. Pueden usarse medicamentos ms fuertes si las medidas iniciales no son efectivas. Pueden aplicarse inyecciones desensibilizantes si los medicamentos y la prevencin no funcionan. La desensibilizacin ocurre cuando un paciente recibe vacunas constantes hasta que el cuerpo se vuelve menos sensible al alrgeno. Asegrese de Medical sales representative seguimiento con su mdico si los problemas continan. INSTRUCCIONES PARA EL CUIDADO EN EL HOGAR No es posible evitar por completo los alrgenos, pero puede reducir los sntomas al tomar medidas para limitar su exposicin a ellos. Es muy til saber exactamente a qu es alrgico para que pueda evitar  sus desencadenantes especficos. SOLICITE ATENCIN MDICA SI:  Lance Muss.  Desarrolla una tos que no cesa fcilmente (persistente).  Le falta el aire.  Comienza a tener sibilancias.  Los sntomas interfieren con las actividades diarias normales.  Esta informacin no tiene Theme park manager el consejo del mdico. Asegrese de hacerle al mdico cualquier pregunta que tenga. Document Released: 08/22/2005 Document Revised: 12/03/2014 Document Reviewed: 07/20/2013 Elsevier Interactive Patient Education  2017 Elsevier Inc.     Edwina Barth, MD [facility]

## 2017-08-16 DIAGNOSIS — Z23 Encounter for immunization: Secondary | ICD-10-CM | POA: Diagnosis not present

## 2017-08-30 ENCOUNTER — Encounter: Payer: Self-pay | Admitting: Physician Assistant

## 2017-08-30 ENCOUNTER — Ambulatory Visit (INDEPENDENT_AMBULATORY_CARE_PROVIDER_SITE_OTHER): Payer: BLUE CROSS/BLUE SHIELD | Admitting: Physician Assistant

## 2017-08-30 VITALS — BP 110/68 | HR 54 | Temp 98.2°F | Resp 16 | Ht 67.72 in | Wt 145.6 lb

## 2017-08-30 DIAGNOSIS — Z1321 Encounter for screening for nutritional disorder: Secondary | ICD-10-CM | POA: Diagnosis not present

## 2017-08-30 DIAGNOSIS — Z0000 Encounter for general adult medical examination without abnormal findings: Secondary | ICD-10-CM

## 2017-08-30 DIAGNOSIS — Z130 Encounter for screening for diseases of the blood and blood-forming organs and certain disorders involving the immune mechanism: Secondary | ICD-10-CM

## 2017-08-30 DIAGNOSIS — Z13228 Encounter for screening for other metabolic disorders: Secondary | ICD-10-CM

## 2017-08-30 DIAGNOSIS — Z23 Encounter for immunization: Secondary | ICD-10-CM | POA: Diagnosis not present

## 2017-08-30 DIAGNOSIS — Z1329 Encounter for screening for other suspected endocrine disorder: Secondary | ICD-10-CM

## 2017-08-30 NOTE — Patient Instructions (Addendum)
Exercise improves every system in the body.  It lowers the risk of heart disease, decreases blood pressure, reduces the symptoms of depression and anxiety, and lowers blood sugar. To receive these benefits, try to get 150 minutes of planned exercise each week.  You can break this 150 minutes up however you like.  For instance, you can perform 30 minutes of brisk walking 5 days a week, or perform 50 minutes 3 days a week.  If you don't like walking, or can't find a safe place to walk, find another way to move that you can enjoy.  Exercise tapes, cycling, stair climbing, swimming, or a combination will be just as good as a walking program. To ensure the proper intensity, you can use the talk test. Essentially, you should be able to carry on a conversation, but you should have to take short breaks from the conversation in order catch your breath.  For pain and fever take 600 mg Ibuprofen AND/OR Tylenol 1000 mg every 8 hours.

## 2017-08-30 NOTE — Progress Notes (Signed)
08/31/2017 9:42 AM   DOB: [birth year] / MRN: [id number]  SUBJECTIVE:  William Owens is a 36 y.o. male presenting for annual exam.  Feels well today.  Has some forms he would like me to sign today.  Is behind on his health maintenance. He is a former smoker. He takes not medication chronically other than OTC allergy meds. HIV negative one year ago.  He has a history of diverticulitis diagnosed on CT.  NO formal exercise. Works as a Location manager.    Immunization History  Administered Date(s) Administered  . Influenza Whole 11/24/2010  . Influenza-Unspecified 09/26/2016  . Td 11/26/2005  . Tdap 08/30/2017    Current Outpatient Prescriptions:  .  cetirizine (ZYRTEC) 10 MG tablet, Take 1 tablet (10 mg total) by mouth daily., Disp: 30 tablet, Rfl: 11 .  fluticasone (FLONASE) 50 MCG/ACT nasal spray, Place 2 sprays into both nostrils 2 (two) times daily at 10 AM and 5 PM., Disp: 16 g, Rfl: 3   He has No Known Allergies.   He  has a past medical history of Allergy and Diverticulitis.    He  reports that he has quit smoking. He has never used smokeless tobacco. He reports that he drinks about 1.2 oz of alcohol per week . He reports that he does not use drugs. He  has no sexual activity history on file. The patient  has no past surgical history on file.  His family history is not on file.  Review of Systems  Constitutional: Negative for chills, diaphoresis and fever.  Eyes: Negative.   Respiratory: Negative for cough, hemoptysis, sputum production, shortness of breath and wheezing.   Cardiovascular: Negative for chest pain, orthopnea and leg swelling.  Gastrointestinal: Negative for abdominal pain, blood in stool, constipation, diarrhea, heartburn, melena, nausea and vomiting.  Genitourinary: Negative for flank pain.  Skin: Negative for rash.  Neurological: Negative for dizziness, sensory change, speech change, focal weakness and headaches.    The problem list and  medications were reviewed and updated by myself where necessary and exist elsewhere in the encounter.   OBJECTIVE:  BP 110/68 (BP Location: Right Arm, Patient Position: Sitting, Cuff Size: Normal)   Pulse (!) 54   Temp 98.2 F (36.8 C) (Oral)   Resp 16   Ht 5' 7.72" (1.72 m)   Wt 145 lb 9.6 oz (66 kg)   SpO2 99%   BMI 22.32 kg/m   Physical Exam  Constitutional: He is oriented to person, place, and time. He appears well-developed. He is active and cooperative.  Non-toxic appearance.  Eyes: Pupils are equal, round, and reactive to light. EOM are normal.  Cardiovascular: Normal rate, regular rhythm, S1 normal, S2 normal, normal heart sounds, intact distal pulses and normal pulses.  Exam reveals no gallop and no friction rub.   No murmur heard. Pulmonary/Chest: Effort normal. No stridor. No tachypnea. No respiratory distress. He has no wheezes. He has no rales.  Abdominal: He exhibits no distension.  Musculoskeletal: He exhibits no edema.  Neurological: He is alert and oriented to person, place, and time. He has normal strength and normal reflexes. He is not disoriented. No cranial nerve deficit or sensory deficit. He exhibits normal muscle tone. Coordination and gait normal.  Skin: Skin is warm and dry. He is not diaphoretic. No pallor.  Psychiatric: His behavior is normal.  Vitals reviewed.  Lab Results  Component Value Date   WBC 6.3 08/11/2016   HGB 15.7 08/11/2016   HCT 46.3  08/11/2016   MCV 90.3 08/11/2016   PLT 219 08/11/2016    Visual Acuity Screening   Right eye Left eye Both eyes  Without correction:     With correction:      Results for orders placed or performed in visit on 08/30/17 (from the past 72 hour(s))  Lipid panel     Status: None   Collection Time: 08/30/17  4:20 PM  Result Value Ref Range   Cholesterol, Total 149 100 - 199 mg/dL   Triglycerides 76 0 - 149 mg/dL   HDL 58 >16 mg/dL   VLDL Cholesterol Cal 15 5 - 40 mg/dL   LDL  Calculated 76 0 - 99 mg/dL   Chol/HDL Ratio 2.6 0.0 - 5.0 ratio    Comment:                                   T. Chol/HDL Ratio                                             Men  Women                               1/2 Avg.Risk  3.4    3.3                                   Avg.Risk  5.0    4.4                                2X Avg.Risk  9.6    7.1                                3X Avg.Risk 23.4   11.0   Hemoglobin A1c     Status: Abnormal   Collection Time: 08/30/17  4:20 PM  Result Value Ref Range   Hgb A1c MFr Bld 5.9 (H) 4.8 - 5.6 %    Comment:          Prediabetes: 5.7 - 6.4          Diabetes: >6.4          Glycemic control for adults with diabetes: <7.0    Est. average glucose Bld gHb Est-mCnc 123 mg/dL  Basic Metabolic Panel     Status: Abnormal   Collection Time: 08/30/17  4:20 PM  Result Value Ref Range   Glucose 87 65 - 99 mg/dL   BUN 15 6 - 20 mg/dL   Creatinine, Ser 1.09 (L) 0.76 - 1.27 mg/dL   GFR calc non Af Amer 121 >59 mL/min/1.73   GFR calc Af Amer 140 >59 mL/min/1.73   BUN/Creatinine Ratio 21 (H) 9 - 20   Sodium 141 134 - 144 mmol/L   Potassium 4.4 3.5 - 5.2 mmol/L   Chloride 103 96 - 106 mmol/L   CO2 24 20 - 29 mmol/L   Calcium 9.3 8.7 - 10.2 mg/dL  Hepatic Function Panel     Status: None   Collection Time: 08/30/17  4:20 PM  Result Value Ref Range   Total Protein 7.2 6.0 - 8.5 g/dL   Albumin 4.6 3.5 - 5.5 g/dL   Bilirubin Total 0.2 0.0 - 1.2 mg/dL   Bilirubin, Direct 9.60 0.00 - 0.40 mg/dL   Alkaline Phosphatase 97 39 - 117 IU/L   AST 18 0 - 40 IU/L   ALT 14 0 - 44 IU/L  Folate     Status: None   Collection Time: 08/30/17  4:20 PM  Result Value Ref Range   Folate 15.2 >3.0 ng/mL    Comment: A serum folate concentration of less than 3.1 ng/mL is considered to represent clinical deficiency.   VITAMIN D 25 Hydroxy (Vit-D Deficiency, Fractures)     Status: None   Collection Time: 08/30/17  4:20 PM  Result Value Ref Range   Vit D, 25-Hydroxy 30.0 30.0 -  100.0 ng/mL    Comment: Vitamin D deficiency has been defined by the Institute of Medicine and an Endocrine Society practice guideline as a level of serum 25-OH vitamin D less than 20 ng/mL (1,2). The Endocrine Society went on to further define vitamin D insufficiency as a level between 21 and 29 ng/mL (2). 1. IOM (Institute of Medicine). 2010. Dietary reference    intakes for calcium and D. [place]: The    [employer]. 2. Holick MF, Bink[place], Bischoff-Ferrari HA, et al.    Evaluation, treatment, and prevention of vitamin D    deficiency: an Endocrine Society clinical practice    guideline. JCEM. 2011 Jul; 96(7):1911-30.   Vitamin B12     Status: None   Collection Time: 08/30/17  4:20 PM  Result Value Ref Range   Vitamin B-12 614 232 - 1,245 pg/mL    No results found.  ASSESSMENT AND PLAN:  Bowman was seen today for annual exam.  Diagnoses and all orders for this visit:  Annual physical exam  Needs flu shot -     Cancel: Flu Vaccine QUAD 36+ mos IM  Need for diphtheria-tetanus-pertussis (Tdap) vaccine -     Tdap vaccine greater than or equal to 7yo IM  Screening for endocrine, nutritional, metabolic and immunity disorder -     Lipid panel -     Hemoglobin A1c -     Basic Metabolic Panel -     Hepatic Function Panel -     Vitamin D, 25-hydroxy -     Vitamin B12 -     Folate -     Folate -     VITAMIN D 25 Hydroxy (Vit-D Deficiency, Fractures) -     Vitamin B12    The patient is advised to call or return to clinic if he does not see an improvement in symptoms, or to seek the care of the closest emergency department if he worsens with the above plan.   Deliah Boston, MHS, PA-C [facility] 08/31/2017 9:42 AM

## 2017-08-31 LAB — BASIC METABOLIC PANEL
BUN/Creatinine Ratio: 21 — ABNORMAL HIGH (ref 9–20)
BUN: 15 mg/dL (ref 6–20)
CO2: 24 mmol/L (ref 20–29)
Calcium: 9.3 mg/dL (ref 8.7–10.2)
Chloride: 103 mmol/L (ref 96–106)
Creatinine, Ser: 0.71 mg/dL — ABNORMAL LOW (ref 0.76–1.27)
GFR calc Af Amer: 140 mL/min/{1.73_m2} (ref >59)
GFR calc non Af Amer: 121 mL/min/{1.73_m2} (ref >59)
GLUCOSE: 87 mg/dL (ref 65–99)
POTASSIUM: 4.4 mmol/L (ref 3.5–5.2)
SODIUM: 141 mmol/L (ref 134–144)

## 2017-08-31 LAB — HEPATIC FUNCTION PANEL
ALBUMIN: 4.6 g/dL (ref 3.5–5.5)
ALT: 14 IU/L (ref 0–44)
AST: 18 IU/L (ref 0–40)
Alkaline Phosphatase: 97 IU/L (ref 39–117)
Bilirubin Total: 0.2 mg/dL (ref 0.0–1.2)
Bilirubin, Direct: 0.09 mg/dL (ref 0.00–0.40)
TOTAL PROTEIN: 7.2 g/dL (ref 6.0–8.5)

## 2017-08-31 LAB — LIPID PANEL
CHOL/HDL RATIO: 2.6 ratio (ref 0.0–5.0)
Cholesterol, Total: 149 mg/dL (ref 100–199)
HDL: 58 mg/dL (ref >39)
LDL Calculated: 76 mg/dL (ref 0–99)
Triglycerides: 76 mg/dL (ref 0–149)
VLDL CHOLESTEROL CAL: 15 mg/dL (ref 5–40)

## 2017-08-31 LAB — HEMOGLOBIN A1C
ESTIMATED AVERAGE GLUCOSE: 123 mg/dL
HEMOGLOBIN A1C: 5.9 % — AB (ref 4.8–5.6)

## 2017-08-31 LAB — VITAMIN B12: Vitamin B-12: 614 pg/mL (ref 232–1245)

## 2017-08-31 LAB — VITAMIN D 25 HYDROXY (VIT D DEFICIENCY, FRACTURES): Vit D, 25-Hydroxy: 30 ng/mL (ref 30.0–100.0)

## 2017-08-31 LAB — FOLATE: Folate: 15.2 ng/mL (ref >3.0)

## 2017-09-09 ENCOUNTER — Telehealth: Payer: Self-pay

## 2017-09-09 NOTE — Telephone Encounter (Signed)
-----   Message from Ofilia Neas, PA-C sent at 09/02/2017  3:23 PM EDT ----- Please call and advise that patient is pre-diabetic and at risk of becoming diabetic.  Please advise weight loss, prudent diet, and 150 mins per week of moderate physical activity, comparable to brisk walking. Deliah Boston, MS, PA-C 3:22 PM, 09/02/2017

## 2017-09-09 NOTE — Telephone Encounter (Signed)
Call placed to make patient aware of results and provider notes. No answer on patient phone. Left message for patient to return call to office along with call back number. S.Glennon Kopko,CMA

## 2017-09-09 NOTE — Telephone Encounter (Signed)
Patient wife returned call, was made aware of lab results. Patient wife verbalized understanding./ S.Kabeer Hoagland,CMA

## 2017-09-09 NOTE — Telephone Encounter (Signed)
-----   Message from Michael L Clark, PA-C sent at 09/02/2017  3:23 PM EDT ----- Please call and advise that patient is pre-diabetic and at risk of becoming diabetic.  Please advise weight loss, prudent diet, and 150 mins per week of moderate physical activity, comparable to brisk walking. Michael Clark, MS, PA-C 3:22 PM, 09/02/2017   

## 2017-11-17 DIAGNOSIS — H5203 Hypermetropia, bilateral: Secondary | ICD-10-CM | POA: Diagnosis not present

## 2017-12-23 ENCOUNTER — Telehealth: Payer: Self-pay | Admitting: Student in an Organized Health Care Education/Training Program

## 2017-12-23 MED ORDER — PERMETHRIN 5 % EX CREA: TOPICAL_CREAM | CUTANEOUS | 0 refills | Status: DC

## 2017-12-23 NOTE — Telephone Encounter (Signed)
Saw son, William Owens MRN [id number] in clinic, diagnosed with scabies. Provided additional rx for father and counseled on washing clothes and sheets. Information handout provided.

## 2018-01-02 ENCOUNTER — Telehealth: Payer: Self-pay | Admitting: Physician Assistant

## 2018-01-02 ENCOUNTER — Other Ambulatory Visit: Payer: Self-pay

## 2018-01-02 MED ORDER — CETIRIZINE HCL 10 MG PO TABS: 10.0000 mg | ORAL_TABLET | Freq: Every day | ORAL | 11 refills | Status: DC

## 2018-01-02 NOTE — Telephone Encounter (Signed)
Refill  Request Zyrtec  LOV 08/30/2017  Last Fill 10/27/2016   [facility]  [address]  [place]    [place]

## 2018-01-02 NOTE — Telephone Encounter (Signed)
Left detailed message on vm that RX was sent to the pharmacy

## 2018-01-02 NOTE — Telephone Encounter (Signed)
Copied from CRM [id number]. Topic: Quick Communication - Rx Refill/Question >> Jan 02, 2018  1:04 PM Crist InfanteHarrald, Kathy J wrote: Medication: cetirizine (ZYRTEC) 10 MG tablet  Has the patient contacted their pharmacy? {yes  several times But no response  [facility] - [place], [place] - [phone] (Phone) [phone] (Fax)

## 2018-01-02 NOTE — Telephone Encounter (Signed)
Rx refill sent.

## 2018-01-02 NOTE — Telephone Encounter (Signed)
Please refill. This is also otc.

## 2018-01-02 NOTE — Telephone Encounter (Signed)
Dr Cleta Albertsaub was the last provider to write him for the Zyrtec, do you want to refill this, he isn't due back in the office until October 2019

## 2018-02-12 ENCOUNTER — Ambulatory Visit (INDEPENDENT_AMBULATORY_CARE_PROVIDER_SITE_OTHER): Payer: BLUE CROSS/BLUE SHIELD | Admitting: Physician Assistant

## 2018-02-12 ENCOUNTER — Other Ambulatory Visit: Payer: Self-pay

## 2018-02-12 ENCOUNTER — Encounter: Payer: Self-pay | Admitting: Physician Assistant

## 2018-02-12 VITALS — BP 118/60 | HR 67 | Temp 98.9°F | Resp 18 | Ht 67.5 in | Wt 146.2 lb

## 2018-02-12 DIAGNOSIS — J020 Streptococcal pharyngitis: Secondary | ICD-10-CM

## 2018-02-12 MED ORDER — AMOXICILLIN 500 MG PO CAPS: 1000.0000 mg | ORAL_CAPSULE | Freq: Two times a day (BID) | ORAL | 0 refills | Status: DC

## 2018-02-12 NOTE — Patient Instructions (Addendum)
I've sent in your medication.     IF you received an x-ray today, you will receive an invoice from [facility]. Please contact [facility] at [phone] with questions or concerns regarding your invoice.   IF you received labwork today, you will receive an invoice from [facility]. Please contact [facility] at [phone] with questions or concerns regarding your invoice.   Our billing staff will not be able to assist you with questions regarding bills from these companies.  You will be contacted with the lab results as soon as they are available. The fastest way to get your results is to activate your My Chart account. Instructions are located on the last page of this paperwork. If you have not heard from us regarding the results in 2 weeks, please contact this office.

## 2018-02-12 NOTE — Progress Notes (Signed)
02/17/2018 8:21 AM   DOB: 09-21-1981 / MRN: [id number]  SUBJECTIVE:  William Owens is a 37 y.o. male presenting for sore throat.  Patient has a history of group A strep essentially every time that he comes here with a sore throat.  Tells me that he had the common cold starting about 6 days ago which seemed to get better in 3 days ago he developed sore throat.  Denies sneezing and eye itching.  He has No Known Allergies.   He  has a past medical history of Allergy and Diverticulitis.    He  reports that he has quit smoking. He has never used smokeless tobacco. He reports that he drinks about 1.2 oz of alcohol per week. He reports that he does not use drugs. He  has no sexual activity history on file. The patient  has no past surgical history on file.  His family history is not on file.  Review of Systems  Constitutional: Negative for chills, diaphoresis and fever.  Eyes: Negative.   Respiratory: Negative for shortness of breath.   Cardiovascular: Negative for chest pain, orthopnea and leg swelling.  Gastrointestinal: Negative for abdominal pain, blood in stool, constipation, diarrhea, heartburn, melena, nausea and vomiting.  Genitourinary: Negative for flank pain.  Skin: Negative for rash.  Neurological: Negative for dizziness, sensory change, speech change, focal weakness and headaches.    The problem list and medications were reviewed and updated by myself where necessary and exist elsewhere in the encounter.   OBJECTIVE:  BP 118/60 (BP Location: Left Arm, Patient Position: Sitting, Cuff Size: Normal)   Pulse 67   Temp 98.9 F (37.2 C) (Oral)   Resp 18   Ht 5' 7.5" (1.715 m)   Wt 146 lb 3.2 oz (66.3 kg)   SpO2 98%   BMI 22.56 kg/m   Physical Exam  Constitutional: He is oriented to person, place, and time. He appears well-developed. He is active and cooperative.  Non-toxic appearance.  HENT:  Mouth/Throat: Oropharyngeal exudate present.  Eyes: Pupils are  equal, round, and reactive to light. EOM are normal.  Cardiovascular: Normal rate, regular rhythm, S1 normal, S2 normal, normal heart sounds, intact distal pulses and normal pulses. Exam reveals no gallop and no friction rub.  No murmur heard. Pulmonary/Chest: Effort normal. No stridor. No tachypnea. No respiratory distress. He has no wheezes. He has no rales.  Abdominal: He exhibits no distension.  Musculoskeletal: He exhibits no edema.  Neurological: He is alert and oriented to person, place, and time. He has normal strength and normal reflexes. He is not disoriented. No cranial nerve deficit or sensory deficit. He exhibits normal muscle tone. Coordination and gait normal.  Skin: Skin is warm and dry. He is not diaphoretic. No pallor.  Psychiatric: His behavior is normal.  Vitals reviewed.   No results found for this or any previous visit (from the past 72 hour(s)).  No results found.  ASSESSMENT AND PLAN:  William Owens was seen today for sinus problem.  Diagnoses and all orders for this visit:  Streptococcal sore throat -     amoxicillin (AMOXIL) 500 MG capsule; Take 2 capsules (1,000 mg total) by mouth 2 (two) times daily.    The patient is advised to call or return to clinic if he does not see an improvement in symptoms, or to seek the care of the closest emergency department if he worsens with the above plan.   Deliah Boston, MHS, PA-C Primary Care at [facility] 02/17/2018 8:21 AM

## 2018-03-02 ENCOUNTER — Other Ambulatory Visit: Payer: Self-pay | Admitting: Emergency Medicine

## 2018-03-04 ENCOUNTER — Other Ambulatory Visit: Payer: Self-pay | Admitting: Pediatrics

## 2018-03-04 MED ORDER — PERMETHRIN 5 % EX CREA: TOPICAL_CREAM | CUTANEOUS | 0 refills | Status: DC

## 2018-03-04 NOTE — Progress Notes (Signed)
Household contact was seen today in clinic with scabies.  Entire household is being treated

## 2018-05-10 ENCOUNTER — Ambulatory Visit: Payer: BLUE CROSS/BLUE SHIELD | Admitting: Urgent Care

## 2018-05-10 ENCOUNTER — Encounter: Payer: Self-pay | Admitting: Urgent Care

## 2018-05-10 ENCOUNTER — Other Ambulatory Visit: Payer: Self-pay

## 2018-05-10 VITALS — BP 116/60 | HR 66 | Temp 98.0°F | Resp 16 | Ht 66.5 in | Wt 143.2 lb

## 2018-05-10 DIAGNOSIS — J3089 Other allergic rhinitis: Secondary | ICD-10-CM

## 2018-05-10 DIAGNOSIS — R1013 Epigastric pain: Secondary | ICD-10-CM

## 2018-05-10 DIAGNOSIS — K5900 Constipation, unspecified: Secondary | ICD-10-CM | POA: Diagnosis not present

## 2018-05-10 LAB — POCT URINALYSIS DIP (MANUAL ENTRY)
Bilirubin, UA: NEGATIVE
Blood, UA: NEGATIVE
GLUCOSE UA: NEGATIVE mg/dL
Ketones, POC UA: NEGATIVE mg/dL
LEUKOCYTES UA: NEGATIVE
Nitrite, UA: NEGATIVE
Protein Ur, POC: NEGATIVE mg/dL
SPEC GRAV UA: 1.02 (ref 1.010–1.025)
Urobilinogen, UA: 0.2 E.U./dL
pH, UA: 7 (ref 5.0–8.0)

## 2018-05-10 MED ORDER — MONTELUKAST SODIUM 10 MG PO TABS: 10.0000 mg | ORAL_TABLET | Freq: Every day | ORAL | 3 refills | Status: DC

## 2018-05-10 MED ORDER — PSEUDOEPHEDRINE HCL ER 120 MG PO TB12: 120.0000 mg | ORAL_TABLET | Freq: Two times a day (BID) | ORAL | 3 refills | Status: DC

## 2018-05-10 MED ORDER — DOCUSATE SODIUM 50 MG PO CAPS: 50.0000 mg | ORAL_CAPSULE | Freq: Two times a day (BID) | ORAL | 0 refills | Status: DC

## 2018-05-10 NOTE — Patient Instructions (Addendum)
Utilice el ablandador de heces con Colace (docusate) 50mg , dos veces al [employer]	[place] al menos 1 [place]. Si las heces se sueltan, redzcalas a Building control surveyor al da durante otra semana. Si las Wellsite geologist sueltas, recrtelas a 1 pastilla [address] tercera [place]. Puede detener el docusate despus y reanudarlo cuando sea necesario para el estreimiento.  Para ayudar a reducir el estreimiento y promover la salud intestinal: 1. Beba por lo menos 64 onzas de Regulatory affairs officer 2. Comer mucha fibra (frutas, verduras, granos enteros, legumbres) 3. Mantngase fsicamente activo o haga ejercicio, como caminar, trotar, nadar, hacer yoga, etc. 4. Para el estreimiento activo, use un ablandador de heces (docusate) o un laxante osmtico (como Miralax) cada da, o segn sea necesario.      Opciones de alimentos para pacientes adultos con enfermedad de reflujo gastroesofgico Food Choices for Gastroesophageal Reflux Disease, Adult Si tiene enfermedad de reflujo gastroesofgico (ERGE), los alimentos que consume y los hbitos de alimentacin son muy importantes. Elegir los alimentos adecuados puede ayudar a Paramedic las molestias ocasionadas por la [employer]. Considere la posibilidad de trabajar con un especialista en dieta y nutricin (nutricionista) que lo ayude a Software engineer saludables. Qu pautas generales debo seguir? Plan de alimentacin  Elija alimentos saludables con bajo contenido de grasa, como frutas, verduras, cereales integrales, productos lcteos descremados, carne magra de vaca, de pescado y de ave.  Haga comidas pequeas con frecuencia en lugar de tres comidas abundantes al da. Coma lentamente, en un ambiente distendido. Evite agacharse o recostarse hasta despus de 2 o 3horas de haber comido.  Limite los alimentos con alto contenido graso como las carnes grasas o los alimentos fritos.  Limite el consumo de [place], [place] y [place] a menos de 8 cucharaditas al Futures trader.  Evite lo  siguiente: ? Consumir alimentos que le ocasionen sntomas. Pueden ser distintos para cada persona. Lleve un registro de los alimentos para identificar aquellos que le ocasionen sntomas. ? Consumir alcohol. ? Beber grandes cantidades de lquido con las comidas. ? Comer 2 o 3 horas antes de acostarse.  Cocine los alimentos utilizando mtodos que no sean la fritura. Esto puede Writer, Technical sales engineer y hervir. Estilo de vida   Mantenga un peso saludable. Pregunte a su mdico cul es el peso saludable para usted. Si debe perder peso, hable con su mdico para hacerlo de manera segura.  Realice actividad fsica durante, al menos, 30 minutos 5 das por semana o ms, o segn lo indicado por su mdico.  Evite usar ropa ajustada alrededor de la cintura y Naval architect.  No consuma ningn producto que contenga nicotina o tabaco, como cigarrillos y Administrator, Civil Service. Si necesita ayuda para dejar de fumar, consulte al mdico.  Duerma con la cabecera de la cama elevada. Use una cua debajo del colchn o bloques debajo del armazn de la cama para Pharmacologist la cabecera de la cama elevada. Qu alimentos no se recomiendan? Esta podra no ser [employer]. Hable con el nutricionista sobre las mejores opciones alimenticias para usted. Carbohidratos Pasteles o panes sin levadura con grasa agregada. Tostadas francesas. Verduras Verduras fritas en abundante aceite. Papas fritas. Cualquier verdura que est preparada con grasa agregada. Cualquier verdura que le ocasione sntomas. Para algunas personas, estas pueden incluir tomates y productos con tomate, [place], cebollas y [place], y rbanos picantes. Frutas Cualquier fruta que est preparada con grasa agregada. Cualquier fruta que le ocasione sntomas. Para algunas personas, estas pueden incluir, las frutas ctricas como naranja, pomelo, pia  y limn. Carnes y otros alimentos ricos en protenas Carnes de alto contenido graso como carne grasa de vaca o  cerdo, salchichas, costillas, [place], salchicha, salame y tocino. Carnes o protenas fritas, lo que incluye pescado frito y pollo frito. Nueces y Drowning Creekmantequillas de frutos secos. Lcteos Leche entera y Lovel[place]leche con chocolate. PPG IndustriesCrema cida. Crema. Helados. Queso crema. Batidos con WPS Resourcesleche. Bebidas Caf y t negro, con o sin cafena. Bebidas carbonatadas. Refrescos. Bebidas energizantes. Jugo de fruta hecho con frutas cidas (como naranja o pomelo). Jugo de tomate. Bebidas alcohlicas. Grasas y Barnes & Nobleaceites Mantequilla. Margarina. Lardo. Mantequilla clarificada. Dulces y postres Chocolate y cacao. Rosquillas. Condimentos y otros alimentos [place]Pimienta. Menta y mentol. Cualquier condimento, hierbas o aderezos que le ocasionen sntomas. Para algunas personas, esto puede incluir curry, salsa picante o aderezos para ensalada a base de vinagre. Resumen  Si tiene enfermedad de reflujo gastroesofgico (ERGE), las elecciones de alimentos y [place] Lakeestilo de vida son muy importantes para ayudar a Paramedicaliviar las molestias de la [place]ERGE.  Haga comidas pequeas con frecuencia en lugar de tres comidas abundantes al da. Coma lentamente, en un ambiente distendido. Evite agacharse o recostarse hasta despus de 2 o 3horas de haber comido.  Limite los alimentos con alto contenido graso como la carne grasa o los alimentos fritos. Esta informacin no tiene Theme park managercomo fin reemplazar el consejo del mdico. Asegrese de hacerle al mdico cualquier pregunta que tenga. Document Released: 08/22/2005 Document Revised: 03/04/2017 Document Reviewed: 09/16/2013 Elsevier Interactive Patient Education  2018 [employer]vier Inc.     Estreimiento, en adultos Constipation, Adult Estreimiento significa que una persona defeca en una semana menos que lo normal, tiene dificultad para defecar, o las heces son secas, duras, o ms grandes que lo normal. El estreimiento podra estar provocado por una enfermedad preexistente. Puede empeorar con la edad si una persona toma  ciertos medicamentos y no toma suficiente lquido. Siga estas indicaciones en su casa: Qu debe comer y beber   Consuma alimentos con alto contenido de [place]fibra, como frutas y verduras frescas, cereales integrales y frijoles.  Limite los alimentos ricos en grasas y con bajo contenido de [place] Cityfibra, o muy procesados, como las papas fritas, [place]amb[place]uesas, McConnell AFBgalletas, dulces y refrescos.  Beba suficiente lquido para Photographermantener la orina clara o de color amarillo plido. Instrucciones generales  Haga actividad fsica habitualmente o como se lo haya indicado el mdico.  Vaya al bao cuando sienta la necesidad de ir. No se aguante las ganas.  Tome los medicamentos de venta libre y los recetados solamente como se lo haya indicado el mdico. Estos incluyen los suplementos de [place]fibra.  Practique ejercicios de rehabilitacin del suelo plvico, como la respiracin profunda mientras relaja la parte inferior del abdomen y la relajacin del suelo plvico mientras defeca.  Controle su afeccin para Insurance risk surveyordetectar cualquier cambio.  Concurra a todas las visitas de [address] como se lo haya indicado el mdico. Esto es importante. Comunquese con un mdico si:  Su dolor empeora.  Tiene fiebre.  No defeca despus de 4das.  Vomita.  No tiene hambre.  Pierde peso.  Tiene una hemorragia en el ano.  Las heces son delgadas como un lpiz. Solicite ayuda de inmediato si:  Tiene fiebre y los sntomas empeoran repentinamente.  Observa que se filtran heces o hay sangre en las heces.  Tiene el abdomen distendido.  Siente un dolor intenso en el abdomen.  Se siente mareado o se desmaya. Esta informacin no tiene Theme park managercomo fin reemplazar el consejo del mdico. Asegrese de hacerle al mdico cualquier  pregunta que tenga. Document Released: 12/02/2007 Document Revised: 03/04/2017 Document Reviewed: 05/02/2016 Elsevier Interactive Patient Education  2018 [employer].     IF you received an x-ray today, you will  receive an invoice from [facility]. Please contact [facility] at [phone] with questions or concerns regarding your invoice.   IF you received labwork today, you will receive an invoice from [facility]. Please contact [facility] at [phone] with questions or concerns regarding your invoice.   Our billing staff will not be able to assist you with questions regarding bills from these companies.  You will be contacted with the lab results as soon as they are available. The fastest way to get your results is to activate your My Chart account. Instructions are located on the last page of this paperwork. If you have not heard from [place] regarding the results in 2 weeks, please contact this office.

## 2018-05-10 NOTE — Progress Notes (Signed)
MRN: [id number] DOB: 12/08/1980  Subjective:   William Owens is a 37 y.o. male presenting for 3 day history of intermittent epigastric pain, bloating, burning type sensation, heartburn. Has also been burping excessively. Has tried APAP, Denies fever, n/v. Patient has been using a laxative daily for some time now for management of constipation. Denies bloody stools. Is starting to eat a lot healthier now. Denies smoking cigarettes.  Also reports that he has had intermittent right ear pain over the past week.  Has long-standing history of chronic allergies from being exposed to allergens at work.  Currently takes Zyrtec and Flonase.  Also notes mild sore throat intermittently over the past few days.  Denies cough, sinus pain, ear drainage, chest pain, shortness of breath, wheezing.  William Owens has a current medication list which includes the following prescription(s): cetirizine and fluticasone. Also has No Known Allergies.  William Owens  has a past medical history of Allergy and Diverticulitis. Also  has no past surgical history on file.  Objective:   Vitals: BP 116/60 (BP Location: Right Arm, Patient Position: Sitting, Cuff Size: Normal)   Pulse 66   Temp 98 F (36.7 C) (Oral)   Resp 16   Ht 5' 6.5" (1.689 m)   Wt 143 lb 3.2 oz (65 kg)   SpO2 100%   BMI 22.77 kg/m   Physical Exam  Constitutional: He is oriented to person, place, and time. He appears well-developed and well-nourished.  HENT:  Right Ear: Tympanic membrane and ear canal normal.  Left Ear: Tympanic membrane and ear canal normal.  Nose: No mucosal edema, rhinorrhea or sinus tenderness.  Mouth/Throat: Oropharynx is clear and moist.  Eyes: No scleral icterus.  Cardiovascular: Normal rate, regular rhythm and intact distal pulses. Exam reveals no gallop and no friction rub.  No murmur heard. Pulmonary/Chest: No respiratory distress. He has no wheezes. He has no rales.  Abdominal: Soft. Bowel sounds are normal. He  exhibits no distension and no mass. There is tenderness. There is no rebound and no guarding (generalized over left abdomen).  Neurological: He is alert and oriented to person, place, and time.  Skin: Skin is warm and dry.  Psychiatric: He has a normal mood and affect.    Results for orders placed or performed in visit on 05/10/18 (from the past 24 hour(s))  POCT urinalysis dipstick     Status: Abnormal   Collection Time: 05/10/18 11:26 AM  Result Value Ref Range   Color, UA yellow yellow   Clarity, UA cloudy (A) clear   Glucose, UA negative negative mg/dL   Bilirubin, UA negative negative   Ketones, POC UA negative negative mg/dL   Spec Grav, UA 1.3241.020 4.0101.010 - 1.025   Blood, UA negative negative   pH, UA 7.0 5.0 - 8.0   Protein Ur, POC negative negative mg/dL   Urobilinogen, UA 0.2 0.2 or 1.0 E.U./dL   Nitrite, UA Negative Negative   Leukocytes, UA Negative Negative   Assessment and Plan :   Abdominal pain, epigastric - Plan: POCT urinalysis dipstick, H. pylori breath test, Comprehensive metabolic panel, CBC  Constipation, unspecified constipation type  Non-seasonal allergic rhinitis due to other allergic trigger  Patient has a remote history of consult with a GI doctor, states that he was told to use Metamucil as a laxative when having had constipation.  Counseled patient that he needs to stop using laxatives and actively eat healthier foods including a high-fiber diet of salads, vegetables, fruits.  Patient states that  he does not always want to do this and admits that his constipation worsens when he is not getting fiber in his diet.  For now we will use docusate to help with the stools soft and properties, recommended dietary modifications.  Labs pending.  Will add Singulair and use Sudafed as needed for allergic rhinitis.  Maintain Zyrtec and Flonase.  Follow-up with test results.  William Bamberg, PA-C Primary Care at [facility] [phone] 05/10/2018   11:30 AM

## 2018-05-11 LAB — COMPREHENSIVE METABOLIC PANEL
ALT: 13 IU/L (ref 0–44)
AST: 18 IU/L (ref 0–40)
Albumin/Globulin Ratio: 2.3 — ABNORMAL HIGH (ref 1.2–2.2)
Albumin: 4.8 g/dL (ref 3.5–5.5)
Alkaline Phosphatase: 85 IU/L (ref 39–117)
BILIRUBIN TOTAL: 0.2 mg/dL (ref 0.0–1.2)
BUN/Creatinine Ratio: 18 (ref 9–20)
BUN: 14 mg/dL (ref 6–20)
CALCIUM: 9.6 mg/dL (ref 8.7–10.2)
CHLORIDE: 102 mmol/L (ref 96–106)
CO2: 25 mmol/L (ref 20–29)
Creatinine, Ser: 0.8 mg/dL (ref 0.76–1.27)
GFR calc Af Amer: 132 mL/min/{1.73_m2} (ref >59)
GFR, EST NON AFRICAN AMERICAN: 114 mL/min/{1.73_m2} (ref >59)
GLUCOSE: 96 mg/dL (ref 65–99)
Globulin, Total: 2.1 g/dL (ref 1.5–4.5)
Potassium: 3.9 mmol/L (ref 3.5–5.2)
Sodium: 141 mmol/L (ref 134–144)
TOTAL PROTEIN: 6.9 g/dL (ref 6.0–8.5)

## 2018-05-11 LAB — CBC
HEMATOCRIT: 44.9 % (ref 37.5–51.0)
HEMOGLOBIN: 15.2 g/dL (ref 13.0–17.7)
MCH: 31.1 pg (ref 26.6–33.0)
MCHC: 33.9 g/dL (ref 31.5–35.7)
MCV: 92 fL (ref 79–97)
Platelets: 213 10*3/uL (ref 150–450)
RBC: 4.88 x10E6/uL (ref 4.14–5.80)
RDW: 14.7 % (ref 12.3–15.4)
WBC: 4.3 10*3/uL (ref 3.4–10.8)

## 2018-05-13 LAB — H. PYLORI BREATH COLLECTION

## 2018-05-13 LAB — H. PYLORI BREATH TEST: H PYLORI BREATH TEST: NEGATIVE

## 2018-08-11 ENCOUNTER — Other Ambulatory Visit: Payer: Self-pay | Admitting: Emergency Medicine

## 2018-08-27 ENCOUNTER — Encounter: Payer: BLUE CROSS/BLUE SHIELD | Admitting: Emergency Medicine

## 2018-09-19 ENCOUNTER — Encounter: Payer: Self-pay | Admitting: Emergency Medicine

## 2018-09-19 ENCOUNTER — Ambulatory Visit (INDEPENDENT_AMBULATORY_CARE_PROVIDER_SITE_OTHER): Payer: BLUE CROSS/BLUE SHIELD | Admitting: Emergency Medicine

## 2018-09-19 ENCOUNTER — Other Ambulatory Visit: Payer: Self-pay

## 2018-09-19 VITALS — BP 110/65 | HR 67 | Temp 98.4°F | Resp 16 | Ht 66.5 in | Wt 142.2 lb

## 2018-09-19 DIAGNOSIS — Z0000 Encounter for general adult medical examination without abnormal findings: Secondary | ICD-10-CM

## 2018-09-19 NOTE — Patient Instructions (Addendum)

## 2018-09-19 NOTE — Progress Notes (Signed)
William Owens 36 y.o.   Chief Complaint  Patient presents with  . Annual Exam    for insurance points    HISTORY OF PRESENT ILLNESS: This is a 37 y.o. male Here for annual exam; no complaints and no medical concerns. No chronic medical problems.  No medications. Non-smoker.  No EtOH abuser. Healthy lifestyle.  HPI   Prior to Admission medications   Medication Sig Start Date End Date Taking? Authorizing Provider  cetirizine (ZYRTEC) 10 MG tablet Take 1 tablet (10 mg total) by mouth daily. 01/02/18  Yes Ofilia Neas, PA-C  fluticasone (FLONASE) 50 MCG/ACT nasal spray USE 2 SPRAY(S) IN EACH NOSTRIL TWICE DAILY AT  10  AM  &  5  PM 08/12/18  Yes Jidenna Figgs, Eilleen Kempf, MD  docusate sodium (COLACE) 50 MG capsule Take 1 capsule (50 mg total) by mouth 2 (two) times daily. Patient not taking: Reported on 09/19/2018 05/10/18   Wallis Bamberg, PA-C  montelukast (SINGULAIR) 10 MG tablet Take 1 tablet (10 mg total) by mouth at bedtime. Patient not taking: Reported on 09/19/2018 05/10/18   Wallis Bamberg, PA-C  pseudoephedrine (SUDAFED 12 HOUR) 120 MG 12 hr tablet Take 1 tablet (120 mg total) by mouth 2 (two) times daily. Patient not taking: Reported on 09/19/2018 05/10/18   Wallis Bamberg, PA-C    No Known Allergies  Patient Active Problem List   Diagnosis Date Noted  . Rhinitis, allergic 01/23/2015    Past Medical History:  Diagnosis Date  . Allergy   . Diverticulitis     No past surgical history on file.  Social History   Socioeconomic History  . Marital status: Married    Spouse name: Not on file  . Number of children: Not on file  . Years of education: Not on file  . Highest education level: Not on file  Occupational History  . Not on file  Social Needs  . Financial resource strain: Not on file  . Food insecurity:    Worry: Not on file    Inability: Not on file  . Transportation needs:    Medical: Not on file    Non-medical: Not on file  Tobacco Use  . Smoking  status: Former Games developer  . Smokeless tobacco: Never Used  Substance and Sexual Activity  . Alcohol use: Yes    Alcohol/week: 1.0 - 2.0 standard drinks    Types: 1 - 2 Standard drinks or equivalent per week    Comment: social  . Drug use: No  . Sexual activity: Not on file  Lifestyle  . Physical activity:    Days per week: Not on file    Minutes per session: Not on file  . Stress: Not on file  Relationships  . Social connections:    Talks on phone: Not on file    Gets together: Not on file    Attends religious service: Not on file    Active member of club or organization: Not on file    Attends meetings of clubs or organizations: Not on file    Relationship status: Not on file  . Intimate partner violence:    Fear of current or ex partner: Not on file    Emotionally abused: Not on file    Physically abused: Not on file    Forced sexual activity: Not on file  Other Topics Concern  . Not on file  Social History Narrative  . Not on file    No family history on file.  Review of Systems  Constitutional: Negative.  Negative for chills, fever and weight loss.  HENT: Negative.  Negative for hearing loss.   Eyes: Negative.  Negative for blurred vision and double vision.  Respiratory: Negative.  Negative for cough and shortness of breath.   Cardiovascular: Negative.  Negative for chest pain and palpitations.  Gastrointestinal: Negative.  Negative for abdominal pain, nausea and vomiting.  Genitourinary: Negative.  Negative for dysuria and hematuria.  Musculoskeletal: Negative.  Negative for back pain, myalgias and neck pain.  Skin: Negative.  Negative for rash.  Neurological: Negative.  Negative for dizziness and headaches.  Endo/Heme/Allergies: Negative.   All other systems reviewed and are negative.    Physical Exam  Constitutional: He is oriented to person, place, and time. He appears well-developed and well-nourished.  HENT:  Head: Normocephalic and atraumatic.  Right  Ear: External ear normal.  Left Ear: External ear normal.  Nose: Nose normal.  Mouth/Throat: Oropharynx is clear and moist.  Eyes: Pupils are equal, round, and reactive to light. Conjunctivae and EOM are normal.  Neck: Normal range of motion. Neck supple.  Cardiovascular: Normal rate, regular rhythm and normal heart sounds.  Pulmonary/Chest: Effort normal and breath sounds normal.  Abdominal: Soft. Bowel sounds are normal. He exhibits no distension. There is no tenderness.  Musculoskeletal: He exhibits no edema or tenderness.  Neurological: He is alert and oriented to person, place, and time. No sensory deficit. He exhibits normal muscle tone.  Skin: Skin is warm and dry. Capillary refill takes less than 2 seconds. No rash noted.  Psychiatric: He has a normal mood and affect. His behavior is normal.  Vitals reviewed.    ASSESSMENT & PLAN: William Owens was seen today for annual exam.  Diagnoses and all orders for this visit:  Routine general medical examination at a health care facility    Patient Instructions       If you have lab work done today you will be contacted with your lab results within the next 2 weeks.  If you have not heard from [place] then please contact us. The fastest way to get your results is to register for My Chart.   IF you received an x-ray today, you will receive an invoice from [facility]. Please contact [facility] at [phone] with questions or concerns regarding your invoice.   IF you received labwork today, you will receive an invoice from [facility]. Please contact [facility] at [phone] with questions or concerns regarding your invoice.   Our billing staff will not be able to assist you with questions regarding bills from these companies.  You will be contacted with the lab results as soon as they are available. The fastest way to get your results is to activate your My Chart account. Instructions are located on the last page of  this paperwork. If you have not heard from [place] regarding the results in 2 weeks, please contact this office.      Health Maintenance, Male A healthy lifestyle and preventive care is important for your health and wellness. Ask your health care provider about what schedule of regular examinations is right for you. What should I know about weight and diet? Eat a Healthy Diet  Eat plenty of vegetables, fruits, whole grains, low-fat dairy products, and lean protein.  Do not eat a lot of foods high in solid fats, added sugars, or salt.  Maintain a Healthy Weight Regular exercise can help you achieve or maintain a healthy weight. You should:  Do  at least 150 minutes of exercise each week. The exercise should increase your heart rate and make you sweat (moderate-intensity exercise).  Do strength-training exercises at least twice a week.  Watch Your Levels of Cholesterol and Blood Lipids  Have your blood tested for lipids and cholesterol every 5 years starting at 38 years of age. If you are at high risk for heart disease, you should start having your blood tested when you are 38 years old. You may need to have your cholesterol levels checked more often if: ? Your lipid or cholesterol levels are high. ? You are older than 38 years of age. ? You are at high risk for heart disease.  What should I know about cancer screening? Many types of cancers can be detected early and may often be prevented. Lung Cancer  You should be screened every year for lung cancer if: ? You are a current smoker who has smoked for at least 30 years. ? You are a former smoker who has quit within the past 15 years.  Talk to your health care provider about your screening options, when you should start screening, and how often you should be screened.  Colorectal Cancer  Routine colorectal cancer screening usually begins at 38 years of age and should be repeated every 5-10 years until you are 38 years old. You may need  to be screened more often if early forms of precancerous polyps or small growths are found. Your health care provider may recommend screening at an earlier age if you have risk factors for colon cancer.  Your health care provider may recommend using home test kits to check for hidden blood in the stool.  A small camera at the end of a tube can be used to examine your colon (sigmoidoscopy or colonoscopy). This checks for the earliest forms of colorectal cancer.  Prostate and Testicular Cancer  Depending on your age and overall health, your health care provider may do certain tests to screen for prostate and testicular cancer.  Talk to your health care provider about any symptoms or concerns you have about testicular or prostate cancer.  Skin Cancer  Check your skin from head to toe regularly.  Tell your health care provider about any new moles or changes in moles, especially if: ? There is a change in a mole's size, shape, or color. ? You have a mole that is larger than a pencil eraser.  Always use sunscreen. Apply sunscreen liberally and repeat throughout the day.  Protect yourself by wearing long sleeves, pants, a wide-brimmed hat, and sunglasses when outside.  What should I know about heart disease, diabetes, and high blood pressure?  If you are 75-9 years of age, have your blood pressure checked every 3-5 years. If you are 6 years of age or older, have your blood pressure checked every year. You should have your blood pressure measured twice-once when you are at a hospital or clinic, and once when you are not at a hospital or clinic. Record the average of the two measurements. To check your blood pressure when you are not at a hospital or clinic, you can use: ? An automated blood pressure machine at a pharmacy. ? A home blood pressure monitor.  Talk to your health care provider about your target blood pressure.  If you are between 22-32 years old, ask your health care provider if  you should take aspirin to prevent heart disease.  Have regular diabetes screenings by checking your fasting blood sugar  level. ? If you are at a normal weight and have a low risk for diabetes, have this test once every three years after the age of 108. ? If you are overweight and have a high risk for diabetes, consider being tested at a younger age or more often.  A one-time screening for abdominal aortic aneurysm (AAA) by ultrasound is recommended for men aged 65-75 years who are current or former smokers. What should I know about preventing infection? Hepatitis B If you have a higher risk for hepatitis B, you should be screened for this virus. Talk with your health care provider to find out if you are at risk for hepatitis B infection. Hepatitis C Blood testing is recommended for:  Everyone born from [birth year] through 1966.  Anyone with known risk factors for hepatitis C.  Sexually Transmitted Diseases (STDs)  You should be screened each year for STDs including gonorrhea and chlamydia if: ? You are sexually active and are younger than 38 years of age. ? You are older than 39 years of age and your health care provider tells you that you are at risk for this type of infection. ? Your sexual activity has changed since you were last screened and you are at an increased risk for chlamydia or gonorrhea. Ask your health care provider if you are at risk.  Talk with your health care provider about whether you are at high risk of being infected with HIV. Your health care provider may recommend a prescription medicine to help prevent HIV infection.  What else can I do?  Schedule regular health, dental, and eye exams.  Stay current with your vaccines (immunizations).  Do not use any tobacco products, such as cigarettes, chewing tobacco, and e-cigarettes. If you need help quitting, ask your health care provider.  Limit alcohol intake to no more than 2 drinks per day. One drink equals 12 ounces of  beer, 5 ounces of wine, or 1 ounces of hard liquor.  Do not use street drugs.  Do not share needles.  Ask your health care provider for help if you need support or information about quitting drugs.  Tell your health care provider if you often feel depressed.  Tell your health care provider if you have ever been abused or do not feel safe at home. This information is not intended to replace advice given to you by your health care provider. Make sure you discuss any questions you have with your health care provider. Document Released: 05/10/2008 Document Revised: 07/11/2016 Document Reviewed: 08/16/2015 Elsevier Interactive Patient Education  2018 Elsevier Inc.      Edwina Barth, MD [facility]

## 2018-09-30 ENCOUNTER — Ambulatory Visit: Payer: BLUE CROSS/BLUE SHIELD | Admitting: Family Medicine

## 2018-09-30 ENCOUNTER — Encounter: Payer: Self-pay | Admitting: Family Medicine

## 2018-09-30 VITALS — BP 113/71 | HR 70 | Temp 98.1°F | Resp 16 | Ht 66.0 in | Wt 146.0 lb

## 2018-09-30 DIAGNOSIS — J302 Other seasonal allergic rhinitis: Secondary | ICD-10-CM

## 2018-09-30 DIAGNOSIS — J029 Acute pharyngitis, unspecified: Secondary | ICD-10-CM

## 2018-09-30 LAB — POCT RAPID STREP A (OFFICE): Rapid Strep A Screen: NEGATIVE

## 2018-09-30 MED ORDER — CETIRIZINE HCL 10 MG PO TABS
10.0000 mg | ORAL_TABLET | Freq: Every day | ORAL | 11 refills | Status: DC
Start: 2018-09-30 — End: 2019-10-14

## 2018-09-30 MED ORDER — FLUTICASONE PROPIONATE 50 MCG/ACT NA SUSP: 1.0000 | Freq: Two times a day (BID) | NASAL | 3 refills | Status: DC

## 2018-09-30 NOTE — Patient Instructions (Addendum)
If you have lab work done today you will be contacted with your lab results within the next 2 weeks.  If you have not heard from [place] then please contact us. The fastest way to get your results is to register for My Chart.   IF you received an x-ray today, you will receive an invoice from [facility]. Please contact [facility] at [phone] with questions or concerns regarding your invoice.   IF you received labwork today, you will receive an invoice from [place]. Please contact [facility] at [phone] with questions or concerns regarding your invoice.   Our billing staff will not be able to assist you with questions regarding bills from these companies.  You will be contacted with the lab results as soon as they are available. The fastest way to get your results is to activate your My Chart account. Instructions are located on the last page of this paperwork. If you have not heard from [place] regarding the results in 2 weeks, please contact this office.     Rinitis alrgica en adultos Allergic Rhinitis, Adult La rinitis alrgica es una reaccin alrgica que afecta la membrana mucosa que se encuentra en la nariz. Provoca estornudos, goteo o congestin nasal, y la sensacin de que baja mucosidad por la parte trasera de la garganta(goteo posnasal). La rinitis alrgica puede ser de leve a grave. [employer] tipos de rinitis alrgica:  Astronomer. Este tipo tambin se denomina fiebre del heno. Sucede nicamente durante algunas estaciones.  Perenne. Este tipo puede ocurrir en cualquier momento del ao.  Cules son las causas? Esta afeccin ocurre cuando el sistema de defensa del cuerpo(sistema inmunitario) reacciona a ciertas sustancias inofensivas llamadas alrgenos como si fueran grmenes.  La rinitis alrgica estacional se desencadena por el polen, que puede provenir del csped, los rboles y [place]. La rinitis alrgica perenne puede ser causada por:  Los  caros del polvo en Advice worker.  La caspa de las [place].  Esporas del moho.  Cules son los signos o los sntomas? Los sntomas de esta afeccin incluyen lo siguiente:  Estornudos.  Nariz tapada o que gotea (congestin nasal).  Goteo posnasal.  Escozor en la Darene Lamer.  Ojos llorosos.  Dificultad para dormir.  Somnolencia Administrator.  Cmo se diagnostica? Esta afeccin se puede diagnosticar en funcin de lo siguiente:  Sus antecedentes mdicos.  Un examen fsico.  Estudios para detectar afecciones relacionadas, como las siguientes: ? Asma. ? Ojo rojo. ? Infeccin en los odos. ? Infeccin de las vas respiratorias superiores.  Estudios para Location manager sus sntomas. Por ejemplo, anlisis de sangre y cutneos.  Cmo se trata? No hay cura para esta afeccin, pero el tratamiento puede ayudar a [employer] sntomas. El tratamiento puede incluir lo siguiente:  Tomar medicamentos que [employer] sntomas de la [place], [place] antihistamnicos. Medicamentos que pueden administrarse por inyeccin, aerosol nasal o pldoras.  Evitar el alrgeno.  Desensibilizacin. Este tratamiento implica que se le apliquen inyecciones continuas hasta que su cuerpo se vuelva menos sensible al alrgeno. Este tratamiento se puede realizar si otros tratamientos no son eficaces.  Si tomar medicamentos y Multimedia programmer alrgeno no funciona, se pueden recetar nuevos medicamentos ms fuertes.  Siga estas indicaciones en su casa:  Conozca a qu es Best boy. Los alrgenos comunes incluyen el humo, polvo y [place].  Evite las cosas a las cuales es alrgico. Hay medidas que puede tomar para ayudar a Automotive engineer los alrgenos: ? Reemplace las alfombras  por pisos de madera, baldosas o vinilo. Las alfombras pueden retener la caspa de los animales y [place]. ? No fume. No permita que fumen en su casa. ? Cambie el filtro de la calefaccin y del aire acondicionado al menos una vez al  mes. ? Durante la temporada de alergias:  Mantenga las ventanas cerradas la mayor cantidad de [place] posible.  Planee actividades al aire libre cuando las concentraciones de polen estn en su nivel ms bajo. Normalmente, esto es [employer] de noche.  Cuando ingrese al interior, [place] de ropa y dese una ducha antes de sentarse en los muebles o la cama.  Tome los medicamentos de venta libre y los recetados solamente como se lo haya indicado el mdico.  Oceanographer a todas las visitas de seguimiento como se lo haya indicado el mdico. Esto es importante. Comunquese con un mdico si:  Tiene fiebre.  Tiene tos persistente.  Comienza a emitir un sonido agudo al respirar (sibilancia).  Sus sntomas interfieren con sus actividades diarias normales. Solicite ayuda de inmediato si:  Le falta el aire. Resumen  Esta afeccin puede controlarse al tomar [employer] como se le indique y al Secretary/administrator.  Comunquese con su mdico si tiene tos persistente o fiebre.  Durante la temporada de [place], [place] las ventanas cerradas la mayor cantidad de [place] posible. Esta informacin no tiene Theme park manager el consejo del mdico. Asegrese de hacerle al mdico cualquier pregunta que tenga. Document Released: 08/22/2005 Document Revised: 02/27/2017 Document Reviewed: 02/27/2017 Elsevier Interactive Patient Education  [employer].

## 2018-09-30 NOTE — Progress Notes (Signed)
11/5/20195:14 PM  William Owens 11/02/1981, 37 y.o. male [id number]  Chief Complaint  Patient presents with  . Sore Throat    x 2 days  . Facial Pain    sinus pressure x 2 days  . Nasal Congestion    only in the morning    HPI:   Patient is a 37 y.o. male with past medical history significant for seasonal allergies who presents today for sore throat, nasal congestion and sinus pressure  Has a cold a week ago Ans since then having nasal congestion, nasal dryness, thick drainage Having chills Worried because he started having a sore throat and it looks red - worried he might have strep Ears are popping Tearful, itchy eyes No sneezing Has not been taking anything for allergies No sinus pain No headache   Fall Risk  09/30/2018 09/19/2018 05/10/2018 02/12/2018 08/30/2017  Falls in the past year? 0 No No No No     Depression screen [facility] 2/9 09/30/2018 09/19/2018 05/10/2018  Decreased Interest 0 0 0  Down, Depressed, Hopeless 0 0 0  PHQ - 2 Score 0 0 0    No Known Allergies  Prior to Admission medications   Medication Sig Start Date End Date Taking? Authorizing Provider  cetirizine (ZYRTEC) 10 MG tablet Take 1 tablet (10 mg total) by mouth daily. Patient not taking: Reported on 09/30/2018 01/02/18   Ofilia Neas, PA-C  docusate sodium (COLACE) 50 MG capsule Take 1 capsule (50 mg total) by mouth 2 (two) times daily. Patient not taking: Reported on 09/19/2018 05/10/18   Wallis Bamberg, PA-C  fluticasone [facility]) 50 MCG/ACT nasal spray USE 2 SPRAY(S) IN EACH NOSTRIL TWICE DAILY AT  10  AM  &  5  PM 08/12/18   Sagardia, Eilleen Kempf, MD  montelukast (SINGULAIR) 10 MG tablet Take 1 tablet (10 mg total) by mouth at bedtime. Patient not taking: Reported on 09/19/2018 05/10/18   Wallis Bamberg, PA-C  pseudoephedrine (SUDAFED 12 HOUR) 120 MG 12 hr tablet Take 1 tablet (120 mg total) by mouth 2 (two) times daily. Patient not taking: Reported on 09/19/2018 05/10/18   Wallis Bamberg,  PA-C    Past Medical History:  Diagnosis Date  . Allergy   . Diverticulitis     No past surgical history on file.  Social History   Tobacco Use  . Smoking status: Former Games developer  . Smokeless tobacco: Never Used  Substance Use Topics  . Alcohol use: Yes    Alcohol/week: 1.0 - 2.0 standard drinks    Types: 1 - 2 Standard drinks or equivalent per week    Comment: social    No family history on file.  ROS Per hpi  OBJECTIVE:  Blood pressure 113/71, pulse 70, temperature 98.1 F (36.7 C), temperature source Oral, resp. rate 16, height 5\' 6"  (1.676 m), weight 146 lb (66.2 kg), SpO2 98 %. Body mass index is 23.57 kg/m.   Physical Exam  Constitutional: He is oriented to person, place, and time. He appears well-developed and well-nourished.  HENT:  Head: Normocephalic and atraumatic.  Right Ear: Hearing, tympanic membrane, external ear and ear canal normal.  Left Ear: Hearing, tympanic membrane, external ear and ear canal normal.  Nose: No mucosal edema or rhinorrhea. Right sinus exhibits no maxillary sinus tenderness and no frontal sinus tenderness. Left sinus exhibits no maxillary sinus tenderness and no frontal sinus tenderness.  Mouth/Throat: Oropharynx is clear and moist and mucous membranes are normal. No oropharyngeal exudate.  Eyes: Pupils  are equal, round, and reactive to light. Conjunctivae and EOM are normal.  Neck: Neck supple.  Cardiovascular: Normal rate and regular rhythm. Exam reveals no gallop and no friction rub.  No murmur heard. Pulmonary/Chest: Effort normal and breath sounds normal. He has no wheezes. He has no rales.  Musculoskeletal: He exhibits no edema.  Lymphadenopathy:    He has no cervical adenopathy.  Neurological: He is alert and oriented to person, place, and time.  Skin: Skin is warm and dry.  Psychiatric: He has a normal mood and affect.  Nursing note and vitals reviewed.   Results for orders placed or performed in visit on 09/30/18  (from the past 24 hour(s))  POCT rapid strep A     Status: None   Collection Time: 09/30/18  5:12 PM  Result Value Ref Range   Rapid Strep A Screen Negative Negative      ASSESSMENT and PLAN  1. Seasonal allergies Discussed supportive measures, new meds r/se/b and RTC precautions. Patient educational handout given.  2. Sore throat Patient happy to know that his strep test is negative - POCT rapid strep A  Other orders - cetirizine (ZYRTEC) 10 MG tablet; Take 1 tablet (10 mg total) by mouth daily. - fluticasone (FLONASE) 50 MCG/ACT nasal spray; Place 1 spray into both nostrils 2 (two) times daily.    Return if symptoms worsen or fail to improve.    Myles Lipps, MD Primary Care at [facility] [address] [place], [place] [address] Ph.  [phone] Fax [phone]

## 2019-06-07 ENCOUNTER — Other Ambulatory Visit: Payer: Self-pay | Admitting: Family Medicine

## 2019-06-07 NOTE — Telephone Encounter (Signed)
Requested Prescriptions  Pending Prescriptions Disp Refills  . fluticasone (FLONASE) 50 MCG/ACT nasal spray [Pharmacy Med Name: Fluticasone Propionate 50 MCG/ACT Nasal Suspension] 16 g 0    Sig: Use 1 spray(s) in each nostril twice daily     Ear, Nose, and Throat: Nasal Preparations - Corticosteroids Passed - 06/07/2019  9:18 AM      Passed - Valid encounter within last 12 months    Recent Outpatient Visits          8 months ago Seasonal allergies   [facility] at [facility], Lilia Argue, MD   8 months ago Routine general medical examination at a health care facility   [facility] at [place], Ines Bloomer, MD   1 year ago Abdominal pain, epigastric   Primary Care at [place], [place]   1 year ago Streptococcal sore throat   [facility] at [place] [facility], Audrie Lia, PA-C   1 year ago Annual physical exam   Primary Care at [place], PA-C

## 2019-07-06 ENCOUNTER — Ambulatory Visit: Payer: BLUE CROSS/BLUE SHIELD | Admitting: Emergency Medicine

## 2019-07-06 ENCOUNTER — Encounter: Payer: Self-pay | Admitting: Emergency Medicine

## 2019-07-06 ENCOUNTER — Other Ambulatory Visit: Payer: Self-pay

## 2019-07-06 VITALS — BP 117/73 | HR 59 | Temp 99.0°F | Resp 16 | Wt 153.0 lb

## 2019-07-06 DIAGNOSIS — H9201 Otalgia, right ear: Secondary | ICD-10-CM

## 2019-07-06 DIAGNOSIS — R0981 Nasal congestion: Secondary | ICD-10-CM

## 2019-07-06 DIAGNOSIS — Z1321 Encounter for screening for nutritional disorder: Secondary | ICD-10-CM | POA: Diagnosis not present

## 2019-07-06 DIAGNOSIS — Z13228 Encounter for screening for other metabolic disorders: Secondary | ICD-10-CM | POA: Diagnosis not present

## 2019-07-06 DIAGNOSIS — Z1329 Encounter for screening for other suspected endocrine disorder: Secondary | ICD-10-CM | POA: Diagnosis not present

## 2019-07-06 DIAGNOSIS — Z130 Encounter for screening for diseases of the blood and blood-forming organs and certain disorders involving the immune mechanism: Secondary | ICD-10-CM | POA: Diagnosis not present

## 2019-07-06 DIAGNOSIS — J0100 Acute maxillary sinusitis, unspecified: Secondary | ICD-10-CM

## 2019-07-06 DIAGNOSIS — Z13220 Encounter for screening for lipoid disorders: Secondary | ICD-10-CM

## 2019-07-06 MED ORDER — AMOXICILLIN-POT CLAVULANATE 875-125 MG PO TABS: 1.0000 | ORAL_TABLET | Freq: Two times a day (BID) | ORAL | 0 refills | Status: AC

## 2019-07-06 NOTE — Progress Notes (Signed)
William Owens 38 y.o.   Chief Complaint  Patient presents with  . Ear Pain    RIGHT x 2 days and mucus in throat     HISTORY OF PRESENT ILLNESS: This is a 38 y.o. male complaining of throat drainage for 5 days followed by sinus congestion followed by right ear pain for 2 days.  Probably related to his work where he is exposed to dust constantly.  No other significant symptoms. Has been taking Zyrtec, Flonase, and saline nasal spray almost every day with some relief.  HPI   Prior to Admission medications   Medication Sig Start Date End Date Taking? Authorizing Provider  cetirizine (ZYRTEC) 10 MG tablet Take 1 tablet (10 mg total) by mouth daily. 09/30/18  Yes Rutherford Guys, MD  fluticasone Asencion Islam) 50 MCG/ACT nasal spray Use 1 spray(s) in each nostril twice daily 06/07/19  Yes Rutherford Guys, MD    No Known Allergies  There are no active problems to display for this patient.   Past Medical History:  Diagnosis Date  . Allergy   . Diverticulitis     History reviewed. No pertinent surgical history.  Social History   Socioeconomic History  . Marital status: Married    Spouse name: Not on file  . Number of children: Not on file  . Years of education: Not on file  . Highest education level: Not on file  Occupational History  . Not on file  Social Needs  . Financial resource strain: Not on file  . Food insecurity    Worry: Not on file    Inability: Not on file  . Transportation needs    Medical: Not on file    Non-medical: Not on file  Tobacco Use  . Smoking status: Former Research scientist (life sciences)  . Smokeless tobacco: Never Used  Substance and Sexual Activity  . Alcohol use: Yes    Alcohol/week: 1.0 - 2.0 standard drinks    Types: 1 - 2 Standard drinks or equivalent per week    Comment: social  . Drug use: No  . Sexual activity: Not on file  Lifestyle  . Physical activity    Days per week: Not on file    Minutes per session: Not on file  . Stress: Not on  file  Relationships  . Social Herbalist on phone: Not on file    Gets together: Not on file    Attends religious service: Not on file    Active member of club or organization: Not on file    Attends meetings of clubs or organizations: Not on file    Relationship status: Not on file  . Intimate partner violence    Fear of current or ex partner: Not on file    Emotionally abused: Not on file    Physically abused: Not on file    Forced sexual activity: Not on file  Other Topics Concern  . Not on file  Social History Narrative  . Not on file    History reviewed. No pertinent family history.   Review of Systems  Constitutional: Negative.  Negative for chills and fever.  HENT: Positive for congestion, ear pain and sore throat.   Respiratory: Negative.  Negative for cough and shortness of breath.   Cardiovascular: Negative.  Negative for chest pain and palpitations.  Gastrointestinal: Negative for nausea and vomiting.  Genitourinary: Negative.   Neurological: Negative for dizziness and headaches.  Endo/Heme/Allergies: Negative.   All other systems reviewed  and are negative.   Vitals:   07/06/19 0812  BP: 117/73  Pulse: (!) 59  Resp: 16  Temp: 99 F (37.2 C)  SpO2: 99%    Physical Exam Constitutional:      Appearance: Normal appearance.  HENT:     Head: Normocephalic and atraumatic.     Right Ear: Tympanic membrane, ear canal and external ear normal.     Left Ear: Tympanic membrane, ear canal and external ear normal.     Nose: Congestion present.     Mouth/Throat:     Mouth: Mucous membranes are moist.     Pharynx: Oropharynx is clear. Posterior oropharyngeal erythema present. No oropharyngeal exudate.  Eyes:     Extraocular Movements: Extraocular movements intact.     Conjunctiva/sclera: Conjunctivae normal.     Pupils: Pupils are equal, round, and reactive to light.  Neck:     Musculoskeletal: Normal range of motion and neck supple.  Cardiovascular:      Rate and Rhythm: Normal rate and regular rhythm.     Heart sounds: Normal heart sounds.  Pulmonary:     Effort: Pulmonary effort is normal.     Breath sounds: Normal breath sounds.  Musculoskeletal: Normal range of motion.  Skin:    General: Skin is warm and dry.  Neurological:     General: No focal deficit present.     Mental Status: He is alert and oriented to person, place, and time.      ASSESSMENT & PLAN: William Owens was seen today for ear pain.  Diagnoses and all orders for this visit:  Otalgia of right ear  Sinus congestion  Acute non-recurrent maxillary sinusitis -     amoxicillin-clavulanate (AUGMENTIN) 875-125 MG tablet; Take 1 tablet by mouth 2 (two) times daily for 7 days.    Patient Instructions   Sinusitis, en adultos Sinusitis, Adult La sinusitis es el dolor y la hinchazn (inflamacin) de los senos paranasales. Los senos paranasales son espacios vacos en los huesos alrededor del rostro. Estos se encuentran en los siguientes lugares:  Alrededor de los ojos.  En la mitad de la frente.  Detrs de Architectural technologistla nariz.  En los pmulos. Los senos paranasales y las fosas nasales estn cubiertos de un lquido llamado mucosidad. La mucosidad drena a travs de los senos paranasales. La hinchazn puede atrapar mucosidad en los senos paranasales. Esto permite que se desarrollen grmenes (bacterias, virus u hongos), lo que produce infecciones. La New York Life Insurancemayora de las veces, la causa de esta afeccin es un virus. Cules son las causas? Las causas de esta afeccin son:  White EagleAlergias.  Asma.  Grmenes.  Objetos que obstruyen la nariz o los senos paranasales.  Crecimientos en el interior de la nariz (plipos nasales).  Sustancias qumicas o irritantes que estn presentes en el aire.  Hongos (poco frecuente). Qu incrementa el riesgo? Es ms probable que contraiga esta afeccin si:  Tiene debilitado el sistema de defensa del organismo (sistema inmunitario).  Nada o bucea  mucho.  [place] aerosoles nasales en exceso.  Fuma. Cules son los signos o los sntomas? Los principales sntomas de esta afeccin son dolor y sensacin de presin alrededor de los senos paranasales. Otros sntomas pueden incluir los siguientes:  Nariz tapada (congestin nasal).  Goteo nasal (drenaje).  Hinchazn y calor en los senos paranasales.  Dolor de Turkmenistancabeza.  Dolor dental.  Tos que puede empeorar por la noche.  Mucosidad que se acumula en la garganta o la parte posterior de la nariz (goteo posnasal).  Incapacidad de sentir olores y sabores.  Estar muy cansado (fatiga).  Grant RutsFiebre.  Dolor de Advertising copywritergarganta.  Mal aliento. Cmo se diagnostica? Esta afeccin se diagnostica en funcin de lo siguiente:  Sus sntomas.  Sus antecedentes mdicos.  Un examen fsico.  Pruebas para averiguar si la afeccin es de corta duracin [place](aguda) o de larga duracin (crnica). El mdico puede: ? Revisarle la nariz para detectar crecimientos (plipos). ? Revisarle los senos paranasales con una herramienta que tiene una luz (endoscopio). ? Revisar si tiene alergias o grmenes. ? Hacerle pruebas de diagnstico por imgenes, como una resonancia magntica (RM) o exploracin por tomografa computarizada (TC). Cmo se trata? El tratamiento de esta afeccin depende de la causa y si es de corta o de larga duracin.  Si la causa es un virus, los sntomas deberan desaparecer solos en el trmino de 10das. Pueden darle medicamentos para aliviar los sntomas. Entre ellos, se incluyen los siguientes: ? Medicamentos para Actuaryencoger el tejido inflamado de la Clinical cytogeneticistnariz. ? Medicamentos para tratar alergias (antihistamnicos). ? Un aerosol para tratar la hinchazn de las fosas nasales. ? Enjuagues que ayudan a eliminar la mucosidad espesa de la nariz (lavados con solucin salina nasal).  Si la causa es una bacteria, es posible que el mdico espere para averiguar si usted mejora sin TEFL teachertratamiento. Es posible que le  den un antibitico si usted tiene: ? Una infeccin grave. ? El sistema de defensa del organismo debilitado.  Si la causa son crecimientos en la Darene Lamernariz, es posible que necesite [place]una ciruga. Siga estas indicaciones en su casa: Micron TechnologyMedicamentos  [place], use o aplique los medicamentos de venta libre y los recetados solamente como se lo haya indicado el mdico. Estos pueden incluir aerosoles nasales.  Si le recetaron un antibitico, tmelo como se lo haya indicado el mdico. No deje de tomar los antibiticos aunque comience a Actorsentirse mejor. Hidrtese y humidifique los ambientes   Beba suficiente agua para Pharmacologistmantener el pis (la orina) de color amarillo plido.  Use un humidificador de vapor fro para mantener la humedad de su hogar por encima del 50%.  Inhale vapor durante 10a 15minutos, de 3a 4veces al da, o como se lo haya indicado el mdico. Puede hacer esto en el bao con el vapor del agua caliente de la ducha.  Trate de no exponerse al aire fro o seco. Reposo  Descanse todo lo posible.  Duerma con la cabeza levantada (elevada).  Asegrese de dormir lo suficiente cada noche. Indicaciones generales   Pngase un pao caliente y hmedo en el rostro 3 a 4 veces al da, o con la frecuencia indicada por el mdico. Esto ayuda a Multimedia programmercalmar las molestias.  Lvese las manos frecuentemente con agua y [place]. Use un desinfectante para manos si no dispone de [place] y [place]jab[place].  No fume. Evite estar cerca de personas que fuman (fumador pasivo).  Concurra a todas las visitas de [address]seguimiento como se lo haya indicado el mdico. Esto es importante. Comunquese con un mdico si:  Tiene fiebre.  Sus sntomas empeoran.  Los sntomas no mejoran en el perodo de 10das. Solicite ayuda inmediatamente si:  Tiene un dolor de cabeza muy intenso.  No puede dejar de vomitar.  Tiene dolor muy intenso o hinchazn en la zona del rostro o los ojos.  Tiene dificultad para ver.  Se siente confundido.  Tiene  el cuello rgido.  Tiene dificultad para respirar. Resumen  La sinusitis es la hinchazn de los senos paranasales. Los senos paranasales son espacios vacos en  los huesos alrededor del rostro.  La causa de esta afeccin es la inflamacin o hinchazn de los tejidos que estn en el interior de la [place]ariz. Esto hace que los grmenes queden atrapados. Los grmenes pueden provocar infecciones.  Si le recetaron un antibitico, tmelo como se lo haya indicado el mdico. No deje de tomarlo aunque comience a sentirse mejor.  Concurra a todas las visitas de [address]seguimiento como se lo haya indicado el mdico. Esto es importante. Esta informacin no tiene Theme park managercomo fin reemplazar el consejo del mdico. Asegrese de hacerle al mdico cualquier pregunta que tenga. Document Released: 08/06/2012 Document Revised: 05/28/2018 Document Reviewed: 05/28/2018 Elsevier Patient Education  [employer]2020 Elsevier Inc.     If you have lab work done today you will be contacted with your lab results within the next 2 weeks.  If you have not heard from us then please contact us. The fastest way to get your results is to register for My Chart.   IF you received an x-ray today, you will receive an invoice from [facility]. Please contact [facility] at [phone] with questions or concerns regarding your invoice.   IF you received labwork today, you will receive an invoice from [facility]. Please contact [facility] at [phone] with questions or concerns regarding your invoice.   Our billing staff will not be able to assist you with questions regarding bills from these companies.  You will be contacted with the lab results as soon as they are available. The fastest way to get your results is to activate your My Chart account. Instructions are located on the last page of this paperwork. If you have not heard from us regarding the results in 2 weeks, please contact this office.         Edwina BarthMiguel Graceanna Theissen, MD [facility]

## 2019-07-06 NOTE — Patient Instructions (Addendum)
Sinusitis, en adultos Sinusitis, Adult La sinusitis es el dolor y la hinchazn (inflamacin) de los senos paranasales. Los senos paranasales son espacios vacos en los huesos alrededor del rostro. Estos se encuentran en los siguientes lugares:  Alrededor de los ojos.  En la mitad de la frente.  Detrs de Mudlogger.  En los pmulos. Los senos paranasales y las fosas nasales estn cubiertos de un lquido llamado mucosidad. La mucosidad drena a travs de los senos paranasales. La hinchazn puede atrapar mucosidad en los senos paranasales. Esto permite que se desarrollen grmenes (bacterias, virus u hongos), lo que produce infecciones. La [employer], la causa de esta afeccin es un virus. Cules son las causas? Las causas de esta afeccin son:  [place].  Asma.  Grmenes.  Objetos que obstruyen la nariz o los senos paranasales.  Crecimientos en el interior de la nariz (plipos nasales).  Sustancias qumicas o irritantes que estn presentes en el aire.  Hongos (poco frecuente). Qu incrementa el riesgo? Es ms probable que contraiga esta afeccin si:  Tiene debilitado el sistema de defensa del organismo (sistema inmunitario).  Nada o bucea mucho.  [place] aerosoles nasales en exceso.  Fuma. Cules son los signos o los sntomas? Los principales sntomas de esta afeccin son dolor y sensacin de presin alrededor de los senos paranasales. Otros sntomas pueden incluir los siguientes:  Nariz tapada (congestin nasal).  Goteo nasal (drenaje).  Hinchazn y calor en los senos paranasales.  Dolor de [place].  Dolor dental.  Tos que puede empeorar por la noche.  Mucosidad que se acumula en la garganta o la parte posterior de la nariz (goteo posnasal).  Incapacidad de sentir olores y sabores.  Estar muy cansado (fatiga).  Cristy Hilts.  Dolor de Investment banker, operational.  Mal aliento. Cmo se diagnostica? Esta afeccin se diagnostica en funcin de lo siguiente:  Sus  sntomas.  Sus antecedentes mdicos.  Un examen fsico.  Pruebas para averiguar si la afeccin es de corta duracin [place]) o de larga duracin (crnica). El mdico puede: ? Revisarle la nariz para detectar crecimientos (plipos). ? Revisarle los senos paranasales con una herramienta que tiene una luz (endoscopio). ? Revisar si tiene alergias o grmenes. ? Hacerle pruebas de diagnstico por imgenes, como una resonancia magntica (RM) o exploracin por tomografa computarizada (TC). Cmo se trata? El tratamiento de esta afeccin depende de la causa y si es de corta o de larga duracin.  Si la causa es un virus, los sntomas deberan desaparecer solos en el trmino de 10das. Pueden darle medicamentos para aliviar los sntomas. Entre ellos, se incluyen los siguientes: ? Medicamentos para Medical illustrator tejido inflamado de la Lawyer. ? Medicamentos para tratar alergias (antihistamnicos). ? Un aerosol para tratar la hinchazn de las fosas nasales. ? Enjuagues que ayudan a eliminar la mucosidad espesa de la nariz (lavados con solucin salina nasal).  Si la causa es una bacteria, es posible que el mdico espere para averiguar si usted mejora sin Clinical research associate. Es posible que le den un antibitico si usted tiene: ? Una infeccin grave. ? El sistema de defensa del organismo debilitado.  Si la causa son crecimientos en la Doran Durand, es posible que necesite [place]. Siga estas indicaciones en su casa: [employer] Inc, use o aplique los medicamentos de venta libre y los recetados solamente como se lo haya indicado el mdico. Estos pueden incluir aerosoles nasales.  Si le recetaron un antibitico, tmelo como se lo haya indicado el mdico. No deje de tomar los antibiticos aunque  comience a sentirse mejor. Hidrtese y humidifique los ambientes   Beba suficiente agua para Theatre manager el pis (la orina) de color amarillo plido.  Use un humidificador de vapor fro para mantener la humedad de su  hogar por encima del 50%.  Inhale vapor durante 10a 63minutos, de 3a 4veces al da, o como se lo haya indicado el mdico. Puede hacer esto en el bao con el vapor del agua caliente de la ducha.  Trate de no exponerse al aire fro o seco. Reposo  Descanse todo lo posible.  Duerma con la cabeza levantada (elevada).  Asegrese de dormir lo suficiente cada noche. Indicaciones generales   Pngase un pao caliente y hmedo en el rostro 3 a 4 veces al da, o con la frecuencia indicada por el mdico. Esto ayuda a Building services engineer.  Lvese las manos frecuentemente con agua y [place]. Use un desinfectante para manos si no dispone de [place] y [place].  No fume. Evite estar cerca de personas que fuman (fumador pasivo).  Concurra a todas las visitas de seguimiento como se lo haya indicado el mdico. Esto es importante. Comunquese con un mdico si:  Tiene fiebre.  Sus sntomas empeoran.  Los sntomas no mejoran en el perodo de 10das. Solicite ayuda inmediatamente si:  Tiene un dolor de cabeza muy intenso.  No puede dejar de vomitar.  Tiene dolor muy intenso o hinchazn en la zona del rostro o los ojos.  Tiene dificultad para ver.  Se siente confundido.  Tiene el cuello rgido.  Tiene dificultad para respirar. Resumen  La sinusitis es la hinchazn de los senos paranasales. Los senos paranasales son espacios vacos en los huesos alrededor del rostro.  La causa de esta afeccin es la inflamacin o hinchazn de los tejidos que estn en el interior de la [place]. Esto hace que los grmenes queden atrapados. Los grmenes pueden provocar infecciones.  Si le recetaron un antibitico, tmelo como se lo haya indicado el mdico. No deje de tomarlo aunque comience a sentirse mejor.  Concurra a todas las visitas de seguimiento como se lo haya indicado el mdico. Esto es importante. Esta informacin no tiene Marine scientist el consejo del mdico. Asegrese de hacerle al mdico  cualquier pregunta que tenga. Document Released: 08/06/2012 Document Revised: 05/28/2018 Document Reviewed: 05/28/2018 Elsevier Patient Education  [employer].     If you have lab work done today you will be contacted with your lab results within the next 2 weeks.  If you have not heard from [place] then please contact us. The fastest way to get your results is to register for My Chart.   IF you received an x-ray today, you will receive an invoice from [facility]. Please contact [facility] at [phone] with questions or concerns regarding your invoice.   IF you received labwork today, you will receive an invoice from [facility]. Please contact [facility] at [phone] with questions or concerns regarding your invoice.   Our billing staff will not be able to assist you with questions regarding bills from these companies.  You will be contacted with the lab results as soon as they are available. The fastest way to get your results is to activate your My Chart account. Instructions are located on the last page of this paperwork. If you have not heard from [place] regarding the results in 2 weeks, please contact this office.

## 2019-07-06 NOTE — Addendum Note (Signed)
Addended by: Davina Poke on: 07/06/2019 09:42 AM   Modules accepted: Orders

## 2019-07-07 ENCOUNTER — Telehealth: Payer: Self-pay | Admitting: Emergency Medicine

## 2019-07-07 LAB — COMPREHENSIVE METABOLIC PANEL
ALT: 18 IU/L (ref 0–44)
AST: 19 IU/L (ref 0–40)
Albumin/Globulin Ratio: 1.7 (ref 1.2–2.2)
Albumin: 4.8 g/dL (ref 4.0–5.0)
Alkaline Phosphatase: 98 IU/L (ref 39–117)
BUN/Creatinine Ratio: 17 (ref 9–20)
BUN: 14 mg/dL (ref 6–20)
Bilirubin Total: 0.3 mg/dL (ref 0.0–1.2)
CO2: 22 mmol/L (ref 20–29)
Calcium: 9.8 mg/dL (ref 8.7–10.2)
Chloride: 102 mmol/L (ref 96–106)
Creatinine, Ser: 0.82 mg/dL (ref 0.76–1.27)
GFR calc Af Amer: 130 mL/min/{1.73_m2} (ref >59)
GFR calc non Af Amer: 112 mL/min/{1.73_m2} (ref >59)
Globulin, Total: 2.9 g/dL (ref 1.5–4.5)
Glucose: 106 mg/dL — ABNORMAL HIGH (ref 65–99)
Potassium: 4.2 mmol/L (ref 3.5–5.2)
Sodium: 141 mmol/L (ref 134–144)
Total Protein: 7.7 g/dL (ref 6.0–8.5)

## 2019-07-07 LAB — CBC WITH DIFFERENTIAL/PLATELET
Basophils Absolute: 0 10*3/uL (ref 0.0–0.2)
Basos: 1 %
EOS (ABSOLUTE): 0.1 10*3/uL (ref 0.0–0.4)
Eos: 2 %
Hematocrit: 45.4 % (ref 37.5–51.0)
Hemoglobin: 15.5 g/dL (ref 13.0–17.7)
Immature Grans (Abs): 0 10*3/uL (ref 0.0–0.1)
Immature Granulocytes: 0 %
Lymphocytes Absolute: 2.5 10*3/uL (ref 0.7–3.1)
Lymphs: 44 %
MCH: 31.1 pg (ref 26.6–33.0)
MCHC: 34.1 g/dL (ref 31.5–35.7)
MCV: 91 fL (ref 79–97)
Monocytes Absolute: 0.5 10*3/uL (ref 0.1–0.9)
Monocytes: 9 %
Neutrophils Absolute: 2.5 10*3/uL (ref 1.4–7.0)
Neutrophils: 44 %
Platelets: 210 10*3/uL (ref 150–450)
RBC: 4.98 x10E6/uL (ref 4.14–5.80)
RDW: 13.2 % (ref 11.6–15.4)
WBC: 5.6 10*3/uL (ref 3.4–10.8)

## 2019-07-07 LAB — HEMOGLOBIN A1C
Est. average glucose Bld gHb Est-mCnc: 123 mg/dL
Hgb A1c MFr Bld: 5.9 % — ABNORMAL HIGH (ref 4.8–5.6)

## 2019-07-07 LAB — LIPID PANEL
Chol/HDL Ratio: 3 ratio (ref 0.0–5.0)
Cholesterol, Total: 184 mg/dL (ref 100–199)
HDL: 62 mg/dL (ref >39)
LDL Calculated: 113 mg/dL — ABNORMAL HIGH (ref 0–99)
Triglycerides: 46 mg/dL (ref 0–149)
VLDL Cholesterol Cal: 9 mg/dL (ref 5–40)

## 2019-07-07 NOTE — Telephone Encounter (Signed)
Normal blood results.  Left message.

## 2019-09-01 ENCOUNTER — Other Ambulatory Visit: Payer: Self-pay | Admitting: Family Medicine

## 2019-09-16 ENCOUNTER — Encounter: Payer: BC Managed Care – PPO | Admitting: Emergency Medicine

## 2019-09-22 ENCOUNTER — Ambulatory Visit (INDEPENDENT_AMBULATORY_CARE_PROVIDER_SITE_OTHER): Payer: BC Managed Care – PPO | Admitting: Emergency Medicine

## 2019-09-22 ENCOUNTER — Other Ambulatory Visit: Payer: Self-pay

## 2019-09-22 ENCOUNTER — Encounter: Payer: Self-pay | Admitting: Emergency Medicine

## 2019-09-22 VITALS — BP 108/71 | HR 65 | Temp 98.6°F | Resp 16 | Ht 66.5 in | Wt 156.0 lb

## 2019-09-22 DIAGNOSIS — Z23 Encounter for immunization: Secondary | ICD-10-CM

## 2019-09-22 DIAGNOSIS — Z0000 Encounter for general adult medical examination without abnormal findings: Secondary | ICD-10-CM

## 2019-09-22 NOTE — Patient Instructions (Addendum)
   If you have lab work done today you will be contacted with your lab results within the next 2 weeks.  If you have not heard from us then please contact us. The fastest way to get your results is to register for My Chart.   IF you received an x-ray today, you will receive an invoice from [facility]. Please contact [facility] at [phone] with questions or concerns regarding your invoice.   IF you received labwork today, you will receive an invoice from [facility]. Please contact [facility] at [phone] with questions or concerns regarding your invoice.   Our billing staff will not be able to assist you with questions regarding bills from these companies.  You will be contacted with the lab results as soon as they are available. The fastest way to get your results is to activate your My Chart account. Instructions are located on the last page of this paperwork. If you have not heard from us regarding the results in 2 weeks, please contact this office.      Health Maintenance, Male Adopting a healthy lifestyle and getting preventive care are important in promoting health and wellness. Ask your health care provider about:  The right schedule for you to have regular tests and exams.  Things you can do on your own to prevent diseases and keep yourself healthy. What should I know about diet, weight, and exercise? Eat a healthy diet   Eat a diet that includes plenty of vegetables, fruits, low-fat dairy products, and lean protein.  Do not eat a lot of foods that are high in solid fats, added sugars, or sodium. Maintain a healthy weight Body mass index (BMI) is a measurement that can be used to identify possible weight problems. It estimates body fat based on height and weight. Your health care provider can help determine your BMI and help you achieve or maintain a healthy weight. Get regular exercise Get regular exercise. This is one of the most important things you  can do for your health. Most adults should:  Exercise for at least 150 minutes each week. The exercise should increase your heart rate and make you sweat (moderate-intensity exercise).  Do strengthening exercises at least twice a week. This is in addition to the moderate-intensity exercise.  Spend less time sitting. Even light physical activity can be beneficial. Watch cholesterol and blood lipids Have your blood tested for lipids and cholesterol at 39 years of age, then have this test every 5 years. You may need to have your cholesterol levels checked more often if:  Your lipid or cholesterol levels are high.  You are older than 40 years of age.  You are at high risk for heart disease. What should I know about cancer screening? Many types of cancers can be detected early and may often be prevented. Depending on your health history and family history, you may need to have cancer screening at various ages. This may include screening for:  Colorectal cancer.  Prostate cancer.  Skin cancer.  Lung cancer. What should I know about heart disease, diabetes, and high blood pressure? Blood pressure and heart disease  High blood pressure causes heart disease and increases the risk of stroke. This is more likely to develop in people who have high blood pressure readings, are of African descent, or are overweight.  Talk with your health care provider about your target blood pressure readings.  Have your blood pressure checked: ? Every 3-5 years if you are 18-39   years of age. ? Every year if you are 40 years old or older.  If you are between the ages of 65 and 75 and are a current or former smoker, ask your health care provider if you should have a one-time screening for abdominal aortic aneurysm (AAA). Diabetes Have regular diabetes screenings. This checks your fasting blood sugar level. Have the screening done:  Once every three years after age 45 if you are at a normal weight and have  a low risk for diabetes.  More often and at a younger age if you are overweight or have a high risk for diabetes. What should I know about preventing infection? Hepatitis B If you have a higher risk for hepatitis B, you should be screened for this virus. Talk with your health care provider to find out if you are at risk for hepatitis B infection. Hepatitis C Blood testing is recommended for:  Everyone born from 1945 through 1965.  Anyone with known risk factors for hepatitis C. Sexually transmitted infections (STIs)  You should be screened each year for STIs, including gonorrhea and chlamydia, if: ? You are sexually active and are younger than 39 years of age. ? You are older than 39 years of age and your health care provider tells you that you are at risk for this type of infection. ? Your sexual activity has changed since you were last screened, and you are at increased risk for chlamydia or gonorrhea. Ask your health care provider if you are at risk.  Ask your health care provider about whether you are at high risk for HIV. Your health care provider may recommend a prescription medicine to help prevent HIV infection. If you choose to take medicine to prevent HIV, you should first get tested for HIV. You should then be tested every 3 months for as long as you are taking the medicine. Follow these instructions at home: Lifestyle  Do not use any products that contain nicotine or tobacco, such as cigarettes, e-cigarettes, and chewing tobacco. If you need help quitting, ask your health care provider.  Do not use street drugs.  Do not share needles.  Ask your health care provider for help if you need support or information about quitting drugs. Alcohol use  Do not drink alcohol if your health care provider tells you not to drink.  If you drink alcohol: ? Limit how much you have to 0-2 drinks a day. ? Be aware of how much alcohol is in your drink. In the [place] one drink equals one 12  oz bottle of beer (355 mL), one 5 oz glass of wine (148 mL), or one 1 oz glass of hard liquor (44 mL). General instructions  Schedule regular health, dental, and eye exams.  Stay current with your vaccines.  Tell your health care provider if: ? You often feel depressed. ? You have ever been abused or do not feel safe at home. Summary  Adopting a healthy lifestyle and getting preventive care are important in promoting health and wellness.  Follow your health care provider's instructions about healthy diet, exercising, and getting tested or screened for diseases.  Follow your health care provider's instructions on monitoring your cholesterol and blood pressure. This information is not intended to replace advice given to you by your health care provider. Make sure you discuss any questions you have with your health care provider. Document Released: 05/10/2008 Document Revised: 11/05/2018 Document Reviewed: 11/05/2018 Elsevier Patient Education  2020 [employer]  Mantenimiento de   la salud en los hombres Health Maintenance, Male Adoptar un estilo de vida saludable y recibir atencin preventiva son importantes para promover la salud y el bienestar. Consulte al mdico sobre:  El esquema adecuado para hacerse pruebas y exmenes peridicos.  Cosas que puede hacer por su cuenta para prevenir enfermedades y mantenerse sano. Qu debo saber sobre la dieta, el peso y el ejercicio? Consuma una dieta saludable   Consuma una dieta que incluya muchas verduras, frutas, productos lcteos con bajo contenido de grasa y protenas magras.  No consuma muchos alimentos ricos en grasas slidas, azcares agregados o sodio. Mantenga un peso saludable El ndice de masa muscular (IMC) es una medida que puede utilizarse para identificar posibles problemas de peso. Proporciona una estimacin de la grasa corporal basndose en el peso y la altura. Su mdico puede ayudarle a determinar su IMC y a lograr o  mantener un peso saludable. Haga ejercicio con regularidad Haga ejercicio con regularidad. Esta es una de las prcticas ms importantes que puede hacer por su salud. La mayora de los adultos deben seguir estas pautas:  Realizar, al menos, 150minutos de actividad fsica por semana. El ejercicio debe aumentar la frecuencia cardaca y hacerlo transpirar (ejercicio de intensidad moderada).  Hacer ejercicios de fortalecimiento por lo menos dos veces por semana. Agregue esto a su plan de ejercicio de intensidad moderada.  Pasar menos tiempo sentados. Incluso la actividad fsica ligera puede ser beneficiosa. Controle sus niveles de colesterol y lpidos en la sangre Comience a realizarse anlisis de lpidos y colesterol en la sangre a los 20aos y luego reptalos cada 5aos. Es posible que necesite controlar los niveles de colesterol con mayor frecuencia si:  Sus niveles de lpidos y colesterol son altos.  Es mayor de 40aos.  Presenta un alto riesgo de padecer enfermedades cardacas. Qu debo saber sobre las pruebas de deteccin del cncer? Muchos tipos de cncer pueden detectarse de manera temprana y, a menudo, pueden prevenirse. Segn su historia clnica y sus antecedentes familiares, es posible que deba realizarse pruebas de deteccin del cncer en diferentes edades. Esto puede incluir pruebas de deteccin de lo siguiente:  Cncer colorrectal.  Cncer de prstata.  Cncer de piel.  Cncer de pulmn. Qu debo saber sobre la enfermedad cardaca, la diabetes y la hipertensin arterial? Presin arterial y enfermedad cardaca  La hipertensin arterial causa enfermedades cardacas y aumenta el riesgo de accidente cerebrovascular. Es ms probable que esto se manifieste en las personas que tienen lecturas de presin arterial alta, tienen ascendencia africana o tienen sobrepeso.  Hable con el mdico sobre sus valores de presin arterial deseados.  Hgase controlar la presin  arterial: ? Cada 3 a 5 aos si tiene entre 18 y 39 aos. ? Todos los aos si es mayor de 40aos.  Si tiene entre 65 y 75 aos y es fumador o sola fumar, pregntele al mdico si debe realizarse una prueba de deteccin de aneurisma artico abdominal (AAA) por nica vez. Diabetes Realcese exmenes de deteccin de la diabetes con regularidad. Este anlisis revisa el nivel de azcar en la sangre en ayunas. Hgase las pruebas de deteccin:  Cada tresaos despus de los 45aos de edad si tiene un peso normal y un bajo riesgo de padecer diabetes.  Con ms frecuencia y a partir de una edad inferior si tiene sobrepeso o un alto riesgo de padecer diabetes. Qu debo saber sobre la prevencin de infecciones? Hepatitis B Si tiene un riesgo ms alto de contraer hepatitis B, debe someterse a   un examen de deteccin de este virus. Hable con el mdico para averiguar si tiene riesgo de contraer la infeccin por hepatitis B. Hepatitis C Se recomienda un anlisis de sangre para:  Todos los que nacieron entre 1945 y 1965.  Todas las personas que tengan un riesgo de haber contrado hepatitis C. Enfermedades de transmisin sexual (ETS)  Debe realizarse pruebas de deteccin de ITS todos los aos, incluidas la gonorrea y la clamidia, si: ? Es sexualmente activo y es menor de 24aos. ? Es mayor de 24aos, y el mdico le informa que corre riesgo de tener este tipo de infecciones. ? La actividad sexual ha cambiado desde que le hicieron la ltima prueba de deteccin y tiene un riesgo mayor de tener clamidia o gonorrea. Pregntele al mdico si usted tiene riesgo.  Pregntele al mdico si usted tiene un alto riesgo de contraer VIH. El mdico tambin puede recomendarle un medicamento recetado para ayudar a evitar la infeccin por el VIH. Si elige tomar medicamentos para prevenir el VIH, primero debe hacerse los anlisis de deteccin del VIH. Luego debe hacerse anlisis cada 3meses mientras est tomando los  medicamentos. Siga estas instrucciones en su casa: Estilo de vida  No consuma ningn producto que contenga nicotina o tabaco, como cigarrillos, cigarrillos electrnicos y tabaco de mascar. Si necesita ayuda para dejar de fumar, consulte al mdico.  No consuma drogas.  No comparta agujas.  Solicite ayuda a su mdico si necesita apoyo o informacin para abandonar las drogas. Consumo de alcohol  No beba alcohol si el mdico se lo prohbe.  Si bebe alcohol: ? Limite la cantidad que consume de 0 a 2 medidas por da. ? Est atento a la cantidad de alcohol que hay en las bebidas que toma. En los [place], una medida equivale a una botella de cerveza de 12oz (355ml), un vaso de vino de 5oz (148ml) o un vaso de una bebida alcohlica de alta graduacin de 1oz (44ml). Instrucciones generales  Realcese los estudios de rutina de la salud, dentales y de la vista.  Mantngase al da con las vacunas.  Infrmele a su mdico si: ? Se siente deprimido con frecuencia. ? Alguna vez ha sido vctima de maltrato o no se siente seguro en su casa. Resumen  Adoptar un estilo de vida saludable y recibir atencin preventiva son importantes para promover la salud y el bienestar.  Siga las instrucciones del mdico acerca de una dieta saludable, el ejercicio y la realizacin de pruebas o exmenes para detectar enfermedades.  Siga las instrucciones del mdico con respecto al control del colesterol y la presin arterial. Esta informacin no tiene como fin reemplazar el consejo del mdico. Asegrese de hacerle al mdico cualquier pregunta que tenga. Document Released: 05/10/2008 Document Revised: 12/03/2018 Document Reviewed: 12/03/2018 Elsevier Patient Education  2020 [employer]  

## 2019-09-22 NOTE — Progress Notes (Signed)
William Owens 38 y.o.   Chief Complaint  Patient presents with  . Annual Exam    to complete form for insurance    HISTORY OF PRESENT ILLNESS: This is a 38 y.o. male exam here for annual exam. No chronic medical problems. Recent blood work done last August showed prediabetes.  Normal rest of the labs. Non-smoker no EtOH abuser. Has history of chronic seasonal allergies. Has no complaints or medical concerns today.  HPI   Prior to Admission medications   Medication Sig Start Date End Date Taking? Authorizing Provider  cetirizine (ZYRTEC) 10 MG tablet Take 1 tablet (10 mg total) by mouth daily. 09/30/18  Yes Myles Lipps, MD  fluticasone Aleda Grana) 50 MCG/ACT nasal spray Use 1 spray(s) in each nostril twice daily 09/01/19  Yes Myles Lipps, MD    No Known Allergies  There are no active problems to display for this patient.   Past Medical History:  Diagnosis Date  . Allergy   . Diverticulitis     History reviewed. No pertinent surgical history.  Social History   Socioeconomic History  . Marital status: Married    Spouse name: Not on file  . Number of children: Not on file  . Years of education: Not on file  . Highest education level: Not on file  Occupational History  . Not on file  Social Needs  . Financial resource strain: Not on file  . Food insecurity    Worry: Not on file    Inability: Not on file  . Transportation needs    Medical: Not on file    Non-medical: Not on file  Tobacco Use  . Smoking status: Former Games developer  . Smokeless tobacco: Never Used  Substance and Sexual Activity  . Alcohol use: Yes    Alcohol/week: 1.0 - 2.0 standard drinks    Types: 1 - 2 Standard drinks or equivalent per week    Comment: social  . Drug use: No  . Sexual activity: Not on file  Lifestyle  . Physical activity    Days per week: Not on file    Minutes per session: Not on file  . Stress: Not on file  Relationships  . Social Manufacturing systems engineer on phone: Not on file    Gets together: Not on file    Attends religious service: Not on file    Active member of club or organization: Not on file    Attends meetings of clubs or organizations: Not on file    Relationship status: Not on file  . Intimate partner violence    Fear of current or ex partner: Not on file    Emotionally abused: Not on file    Physically abused: Not on file    Forced sexual activity: Not on file  Other Topics Concern  . Not on file  Social History Narrative  . Not on file    History reviewed. No pertinent family history.   Review of Systems  Constitutional: Negative.  Negative for chills and fever.  HENT: Negative.  Negative for congestion and sore throat.   Respiratory: Negative.  Negative for cough and shortness of breath.   Cardiovascular: Negative.  Negative for chest pain and palpitations.  Gastrointestinal: Negative.  Negative for abdominal pain, diarrhea, nausea and vomiting.  Musculoskeletal: Negative.  Negative for myalgias.  Skin: Negative.  Negative for rash.  Neurological: Negative.  Negative for dizziness and headaches.  Endo/Heme/Allergies: Negative.   All other systems reviewed and  are negative.   Today's Vitals   09/22/19 0928  BP: 108/71  Pulse: 65  Resp: 16  Temp: 98.6 F (37 C)  TempSrc: Oral  SpO2: 98%  Weight: 156 lb (70.8 kg)  Height: 5' 6.5" (1.689 m)   Body mass index is 24.8 kg/m.  Physical Exam Vitals signs reviewed.  Constitutional:      Appearance: Normal appearance.  HENT:     Head: Normocephalic.  Eyes:     Extraocular Movements: Extraocular movements intact.     Conjunctiva/sclera: Conjunctivae normal.     Pupils: Pupils are equal, round, and reactive to light.  Neck:     Musculoskeletal: Normal range of motion and neck supple.  Cardiovascular:     Rate and Rhythm: Normal rate and regular rhythm.     Pulses: Normal pulses.     Heart sounds: Normal heart sounds.  Pulmonary:     Effort:  Pulmonary effort is normal.     Breath sounds: Normal breath sounds.  Abdominal:     General: There is no distension.     Palpations: Abdomen is soft. There is no mass.     Tenderness: There is no abdominal tenderness.  Musculoskeletal: Normal range of motion.  Skin:    General: Skin is warm and dry.     Capillary Refill: Capillary refill takes less than 2 seconds.  Neurological:     General: No focal deficit present.     Mental Status: He is alert and oriented to person, place, and time.  Psychiatric:        Mood and Affect: Mood normal.        Behavior: Behavior normal.      ASSESSMENT & PLAN:   Hung was seen today for annual exam.  Diagnoses and all orders for this visit:  Routine general medical examination at a health care facility  Need for prophylactic vaccination and inoculation against influenza -     Flu Vaccine QUAD 36+ mos IM   Work form filled out.   Patient Instructions       If you have lab work done today you will be contacted with your lab results within the next 2 weeks.  If you have not heard from [place] then please contact us. The fastest way to get your results is to register for My Chart.   IF you received an x-ray today, you will receive an invoice from [facility]. Please contact [facility] at [phone] with questions or concerns regarding your invoice.   IF you received labwork today, you will receive an invoice from [place] [facility]. Please contact [facility] at [phone] with questions or concerns regarding your invoice.   Our billing staff will not be able to assist you with questions regarding bills from these companies.  You will be contacted with the lab results as soon as they are available. The fastest way to get your results is to activate your My Chart account. Instructions are located on the last page of this paperwork. If you have not heard from [place] regarding the results in 2 weeks, please contact this office.       Health Maintenance, Male Adopting a healthy lifestyle and getting preventive care are important in promoting health and wellness. Ask your health care provider about:  The right schedule for you to have regular tests and exams.  Things you can do on your own to prevent diseases and keep yourself healthy. What should I know about diet, weight, and exercise? Eat a healthy  diet   Eat a diet that includes plenty of vegetables, fruits, low-fat dairy products, and lean protein.  Do not eat a lot of foods that are high in solid fats, added sugars, or sodium. Maintain a healthy weight Body mass index (BMI) is a measurement that can be used to identify possible weight problems. It estimates body fat based on height and weight. Your health care provider can help determine your BMI and help you achieve or maintain a healthy weight. Get regular exercise Get regular exercise. This is one of the most important things you can do for your health. Most adults should:  Exercise for at least 150 minutes each week. The exercise should increase your heart rate and make you sweat (moderate-intensity exercise).  Do strengthening exercises at least twice a week. This is in addition to the moderate-intensity exercise.  Spend less time sitting. Even light physical activity can be beneficial. Watch cholesterol and blood lipids Have your blood tested for lipids and cholesterol at 39 years of age, then have this test every 5 years. You may need to have your cholesterol levels checked more often if:  Your lipid or cholesterol levels are high.  You are older than 39 years of age.  You are at high risk for heart disease. What should I know about cancer screening? Many types of cancers can be detected early and may often be prevented. Depending on your health history and family history, you may need to have cancer screening at various ages. This may include screening for:  Colorectal cancer.  Prostate cancer.   Skin cancer.  Lung cancer. What should I know about heart disease, diabetes, and high blood pressure? Blood pressure and heart disease  High blood pressure causes heart disease and increases the risk of stroke. This is more likely to develop in people who have high blood pressure readings, are of African descent, or are overweight.  Talk with your health care provider about your target blood pressure readings.  Have your blood pressure checked: ? Every 3-5 years if you are 80-8 years of age. ? Every year if you are 70 years old or older.  If you are between the ages of 42 and 48 and are a current or former smoker, ask your health care provider if you should have a one-time screening for abdominal aortic aneurysm (AAA). Diabetes Have regular diabetes screenings. This checks your fasting blood sugar level. Have the screening done:  Once every three years after age 78 if you are at a normal weight and have a low risk for diabetes.  More often and at a younger age if you are overweight or have a high risk for diabetes. What should I know about preventing infection? Hepatitis B If you have a higher risk for hepatitis B, you should be screened for this virus. Talk with your health care provider to find out if you are at risk for hepatitis B infection. Hepatitis C Blood testing is recommended for:  Everyone born from [birth year] through 1966.  Anyone with known risk factors for hepatitis C. Sexually transmitted infections (STIs)  You should be screened each year for STIs, including gonorrhea and chlamydia, if: ? You are sexually active and are younger than 39 years of age. ? You are older than 39 years of age and your health care provider tells you that you are at risk for this type of infection. ? Your sexual activity has changed since you were last screened, and you are at increased risk  for chlamydia or gonorrhea. Ask your health care provider if you are at risk.  Ask your health care  provider about whether you are at high risk for HIV. Your health care provider may recommend a prescription medicine to help prevent HIV infection. If you choose to take medicine to prevent HIV, you should first get tested for HIV. You should then be tested every 3 months for as long as you are taking the medicine. Follow these instructions at home: Lifestyle  Do not use any products that contain nicotine or tobacco, such as cigarettes, e-cigarettes, and chewing tobacco. If you need help quitting, ask your health care provider.  Do not use street drugs.  Do not share needles.  Ask your health care provider for help if you need support or information about quitting drugs. Alcohol use  Do not drink alcohol if your health care provider tells you not to drink.  If you drink alcohol: ? Limit how much you have to 0-2 drinks a day. ? Be aware of how much alcohol is in your drink. In the [place] one drink equals one 12 oz bottle of beer (355 mL), one 5 oz glass of wine (148 mL), or one 1 oz glass of hard liquor (44 mL). General instructions  Schedule regular health, dental, and eye exams.  Stay current with your vaccines.  Tell your health care provider if: ? You often feel depressed. ? You have ever been abused or do not feel safe at home. Summary  Adopting a healthy lifestyle and getting preventive care are important in promoting health and wellness.  Follow your health care provider's instructions about healthy diet, exercising, and getting tested or screened for diseases.  Follow your health care provider's instructions on monitoring your cholesterol and blood pressure. This information is not intended to replace advice given to you by your health care provider. Make sure you discuss any questions you have with your health care provider. Document Released: 05/10/2008 Document Revised: 11/05/2018 Document Reviewed: 11/05/2018 Elsevier Patient Education  2020 Elsevier Inc.      Edwina BarthMiguel Rafael Salway, MD [facility]

## 2019-10-12 ENCOUNTER — Other Ambulatory Visit: Payer: Self-pay

## 2019-10-12 DIAGNOSIS — Z20822 Contact with and (suspected) exposure to covid-19: Secondary | ICD-10-CM

## 2019-10-14 ENCOUNTER — Other Ambulatory Visit: Payer: Self-pay | Admitting: Family Medicine

## 2019-10-14 LAB — NOVEL CORONAVIRUS, NAA: SARS-CoV-2, NAA: NOT DETECTED

## 2019-10-15 ENCOUNTER — Telehealth: Payer: Self-pay | Admitting: General Practice

## 2019-10-15 NOTE — Telephone Encounter (Signed)
Negative COVID results given. Patient results "NOT Detected." Caller expressed understanding.   Results Letter mailed 10/15/2019

## 2019-10-20 ENCOUNTER — Telehealth (INDEPENDENT_AMBULATORY_CARE_PROVIDER_SITE_OTHER): Payer: BC Managed Care – PPO | Admitting: Emergency Medicine

## 2019-10-20 ENCOUNTER — Encounter: Payer: Self-pay | Admitting: Emergency Medicine

## 2019-10-20 ENCOUNTER — Other Ambulatory Visit: Payer: Self-pay

## 2019-10-20 VITALS — Ht 66.0 in | Wt 158.0 lb

## 2019-10-20 DIAGNOSIS — J029 Acute pharyngitis, unspecified: Secondary | ICD-10-CM | POA: Diagnosis not present

## 2019-10-20 DIAGNOSIS — Z20822 Contact with and (suspected) exposure to covid-19: Secondary | ICD-10-CM

## 2019-10-20 DIAGNOSIS — Z20828 Contact with and (suspected) exposure to other viral communicable diseases: Secondary | ICD-10-CM

## 2019-10-20 MED ORDER — AZITHROMYCIN 250 MG PO TABS: ORAL_TABLET | ORAL | 0 refills | Status: DC

## 2019-10-20 NOTE — Progress Notes (Signed)
Telemedicine Encounter- SOAP NOTE Established Patient  This telephone encounter was conducted with the patient's (or proxy's) verbal consent via audio telecommunications: yes/no: Yes Patient was instructed to have this encounter in a suitably private space; and to only have persons present to whom they give permission to participate. In addition, patient identity was confirmed by use of name plus two identifiers (DOB and address).  I discussed the limitations, risks, security and privacy concerns of performing an evaluation and management service by telephone and the availability of in person appointments. I also discussed with the patient that there may be a patient responsible charge related to this service. The patient expressed understanding and agreed to proceed.  I spent a total of TIME; 0 MIN TO 60 MIN: 15 minutes talking with the patient or their proxy.  Chief Complaint  Patient presents with  . Sore Throat    per patient x 2 days redness with white sticky pheglm on the back of his throat  . Chills    Subjective   William Owens is a 38 y.o. male established patient. Telephone visit today complaining of sore throat for the last 2 days.  Noticed white patches on his throat and has been having some chills.  Tested negative for COVID-19 8 days ago.  However wife and daughter tested positive.  Him and 2 sons tested negative.  No other associated symptoms.  HPI   There are no active problems to display for this patient.   Past Medical History:  Diagnosis Date  . Allergy   . Diverticulitis     Current Outpatient Medications  Medication Sig Dispense Refill  . cetirizine (ZYRTEC) 10 MG tablet Take 1 tablet by mouth once daily 30 tablet 0  . fluticasone (FLONASE) 50 MCG/ACT nasal spray Use 1 spray(s) in each nostril twice daily 16 g 0   No current facility-administered medications for this visit.     No Known Allergies  Social History   Socioeconomic History   . Marital status: Married    Spouse name: Not on file  . Number of children: Not on file  . Years of education: Not on file  . Highest education level: Not on file  Occupational History  . Not on file  Social Needs  . Financial resource strain: Not on file  . Food insecurity    Worry: Not on file    Inability: Not on file  . Transportation needs    Medical: Not on file    Non-medical: Not on file  Tobacco Use  . Smoking status: Former Games developer  . Smokeless tobacco: Never Used  Substance and Sexual Activity  . Alcohol use: Yes    Alcohol/week: 1.0 - 2.0 standard drinks    Types: 1 - 2 Standard drinks or equivalent per week    Comment: social  . Drug use: No  . Sexual activity: Not on file  Lifestyle  . Physical activity    Days per week: Not on file    Minutes per session: Not on file  . Stress: Not on file  Relationships  . Social Musician on phone: Not on file    Gets together: Not on file    Attends religious service: Not on file    Active member of club or organization: Not on file    Attends meetings of clubs or organizations: Not on file    Relationship status: Not on file  . Intimate partner violence  Fear of current or ex partner: Not on file    Emotionally abused: Not on file    Physically abused: Not on file    Forced sexual activity: Not on file  Other Topics Concern  . Not on file  Social History Narrative  . Not on file    Review of Systems  Constitutional: Positive for chills. Negative for fever.  HENT: Positive for sore throat. Negative for congestion.   Eyes: Negative.   Respiratory: Negative.  Negative for cough, sputum production and shortness of breath.   Cardiovascular: Negative.  Negative for chest pain and palpitations.  Gastrointestinal: Negative.  Negative for abdominal pain, blood in stool, nausea and vomiting.  Genitourinary: Negative.  Negative for dysuria and hematuria.  Musculoskeletal: Negative for myalgias.  Skin:  Negative.  Negative for rash.  Neurological: Negative for dizziness and headaches.  Endo/Heme/Allergies: Negative.     Objective  Alert and oriented x3 in no apparent respiratory distress. Vitals as reported by the patient: Today's Vitals   10/20/19 1128  Weight: 158 lb (71.7 kg)  Height: 5\' 6"  (1.676 m)    There are no diagnoses linked to this encounter. William Owens was seen today for sore throat and chills.  Diagnoses and all orders for this visit:  Sore throat  Acute pharyngitis, unspecified etiology -     azithromycin (ZITHROMAX) 250 MG tablet; Sig as indicated  Exposure to COVID-19 virus   Clinically stable.  No red flag signs or symptoms.  COVID-19 precautions given. Advised to take medications as prescribed, rest, push fluids.  Must contact the office if clinical picture changes or gets worse in the next several days.  I discussed the assessment and treatment plan with the patient. The patient was provided an opportunity to ask questions and all were answered. The patient agreed with the plan and demonstrated an understanding of the instructions.   The patient was advised to call back or seek an in-person evaluation if the symptoms worsen or if the condition fails to improve as anticipated.  I provided 15 minutes of non-face-to-face time during this encounter.  Horald Pollen, MD  Primary Care at [facility]

## 2019-10-26 ENCOUNTER — Telehealth: Payer: Self-pay

## 2019-10-26 ENCOUNTER — Telehealth: Payer: Self-pay | Admitting: *Deleted

## 2019-10-26 NOTE — Telephone Encounter (Signed)
Patient called requesting assistance creating my chart .

## 2019-10-26 NOTE — Telephone Encounter (Signed)
Return to work letter to be picked up at front office for return to work post covid, pt test was neg

## 2019-12-06 ENCOUNTER — Other Ambulatory Visit: Payer: Self-pay | Admitting: Family Medicine

## 2020-01-21 ENCOUNTER — Other Ambulatory Visit: Payer: Self-pay | Admitting: Family Medicine

## 2020-02-01 ENCOUNTER — Telehealth (INDEPENDENT_AMBULATORY_CARE_PROVIDER_SITE_OTHER): Payer: BC Managed Care – PPO | Admitting: Emergency Medicine

## 2020-02-01 ENCOUNTER — Other Ambulatory Visit: Payer: Self-pay

## 2020-02-01 ENCOUNTER — Encounter: Payer: Self-pay | Admitting: Emergency Medicine

## 2020-02-01 VITALS — Ht 66.0 in | Wt 156.0 lb

## 2020-02-01 DIAGNOSIS — J029 Acute pharyngitis, unspecified: Secondary | ICD-10-CM | POA: Diagnosis not present

## 2020-02-01 MED ORDER — AZITHROMYCIN 250 MG PO TABS: ORAL_TABLET | ORAL | 0 refills | Status: DC

## 2020-02-01 NOTE — Patient Instructions (Signed)
° ° ° °  If you have lab work done today you will be contacted with your lab results within the next 2 weeks.  If you have not heard from us then please contact us. The fastest way to get your results is to register for My Chart. ° ° °IF you received an x-ray today, you will receive an invoice from [facility]. Please contact [facility] at [phone] with questions or concerns regarding your invoice.  ° °IF you received labwork today, you will receive an invoice from [facility]. Please contact [facility] at [phone] with questions or concerns regarding your invoice.  ° °Our billing staff will not be able to assist you with questions regarding bills from these companies. ° °You will be contacted with the lab results as soon as they are available. The fastest way to get your results is to activate your My Chart account. Instructions are located on the last page of this paperwork. If you have not heard from us regarding the results in 2 weeks, please contact this office. °  ° ° ° °

## 2020-02-01 NOTE — Progress Notes (Signed)
Telemedicine Encounter- SOAP NOTE Established Patient MyChart video encounter. This video telephone encounter was conducted with the patient's (or proxy's) verbal consent via video audio telecommunications: yes/no: Yes Patient was instructed to have this encounter in a suitably private space; and to only have persons present to whom they give permission to participate. In addition, patient identity was confirmed by use of name plus two identifiers (DOB and address).  I discussed the limitations, risks, security and privacy concerns of performing an evaluation and management service by telephone and the availability of in person appointments. I also discussed with the patient that there may be a patient responsible charge related to this service. The patient expressed understanding and agreed to proceed.  I spent a total of TIME; 0 MIN TO 60 MIN: 15 minutes talking with the patient or their proxy.  Chief Complaint  Patient presents with  . Sore Throat    for 3 days, pt denies loss of taste or smell, no cough or fever    Subjective   William Owens is a 40 y.o. male established patient. Telephone visit today complaining of sore throat that started to 3 days ago progressively getting worse without fever or chills or any other associated symptoms.  Denies any other significant symptoms.  HPI   There are no problems to display for this patient.   Past Medical History:  Diagnosis Date  . Allergy   . Diverticulitis     Current Outpatient Medications  Medication Sig Dispense Refill  . fluticasone (FLONASE) 50 MCG/ACT nasal spray Use 1 spray(s) in each nostril twice daily 16 g 0  . cetirizine (ZYRTEC) 10 MG tablet Take 1 tablet by mouth once daily (Patient not taking: Reported on 02/01/2020) 30 tablet 0   No current facility-administered medications for this visit.    No Known Allergies  Social History   Socioeconomic History  . Marital status: Married    Spouse name:  Not on file  . Number of children: Not on file  . Years of education: Not on file  . Highest education level: Not on file  Occupational History  . Not on file  Tobacco Use  . Smoking status: Former Research scientist (life sciences)  . Smokeless tobacco: Never Used  Substance and Sexual Activity  . Alcohol use: Yes    Alcohol/week: 1.0 - 2.0 standard drinks    Types: 1 - 2 Standard drinks or equivalent per week    Comment: social  . Drug use: No  . Sexual activity: Not on file  Other Topics Concern  . Not on file  Social History Narrative  . Not on file   Social Determinants of Health   Financial Resource Strain:   . Difficulty of Paying Living Expenses: Not on file  Food Insecurity:   . Worried About Charity fundraiser in the Last Year: Not on file  . Ran Out of Food in the Last Year: Not on file  Transportation Needs:   . Lack of Transportation (Medical): Not on file  . Lack of Transportation (Non-Medical): Not on file  Physical Activity:   . Days of Exercise per Week: Not on file  . Minutes of Exercise per Session: Not on file  Stress:   . Feeling of Stress : Not on file  Social Connections:   . Frequency of Communication with Friends and Family: Not on file  . Frequency of Social Gatherings with Friends and Family: Not on file  . Attends Religious Services: Not on file  .  Active Member of Clubs or Organizations: Not on file  . Attends Banker Meetings: Not on file  . Marital Status: Not on file  Intimate Partner Violence:   . Fear of Current or Ex-Partner: Not on file  . Emotionally Abused: Not on file  . Physically Abused: Not on file  . Sexually Abused: Not on file    Review of Systems  Constitutional: Negative.  Negative for chills and fever.  HENT: Positive for congestion and sore throat.   Respiratory: Negative.  Negative for cough and shortness of breath.   Cardiovascular: Negative.  Negative for chest pain and palpitations.  Gastrointestinal: Negative.  Negative  for abdominal pain, blood in stool, diarrhea, melena, nausea and vomiting.  Genitourinary: Negative.  Negative for dysuria and hematuria.  Skin: Negative.  Negative for rash.  Neurological: Negative.  Negative for dizziness and headaches.  All other systems reviewed and are negative.   Objective  Alert and oriented x3 in no apparent respiratory distress. Vitals as reported by the patient: Today's Vitals   02/01/20 1526  Weight: 156 lb (70.8 kg)  Height: 5\' 6"  (1.676 m)    There are no diagnoses linked to this encounter. [name] was seen today for sore throat.  Diagnoses and all orders for this visit:  Sore throat  Acute pharyngitis, unspecified etiology -     azithromycin (ZITHROMAX) 250 MG tablet; Sig as indicated     I discussed the assessment and treatment plan with the patient. The patient was provided an opportunity to ask questions and all were answered. The patient agreed with the plan and demonstrated an understanding of the instructions.   The patient was advised to call back or seek an in-person evaluation if the symptoms worsen or if the condition fails to improve as anticipated.  I provided 15 minutes of non-face-to-face time during this encounter.  , MD  Primary Care at [facility]

## 2020-02-28 ENCOUNTER — Other Ambulatory Visit: Payer: Self-pay | Admitting: Family Medicine

## 2020-02-28 NOTE — Telephone Encounter (Signed)
Requested Prescriptions  Pending Prescriptions Disp Refills  . fluticasone (FLONASE) 50 MCG/ACT nasal spray [Pharmacy Med Name: Fluticasone Propionate 50 MCG/ACT Nasal Suspension] 16 g 0    Sig: Use 1 spray(s) in each nostril twice daily     Ear, Nose, and Throat: Nasal Preparations - Corticosteroids Passed - 02/28/2020  8:42 AM      Passed - Valid encounter within last 12 months    Recent Outpatient Visits          3 weeks ago Sore throat   Primary Care at [facility], Eilleen Kempf, MD   4 months ago Sore throat   Primary Care at [place], Eilleen Kempf, MD   5 months ago Routine general medical examination at a health care facility   Primary Care at [place], Eilleen Kempf, MD   7 months ago Otalgia of right ear   Primary Care at [facility], Eilleen Kempf, MD   1 year ago Seasonal allergies   [facility] at [facility], Meda Coffee, MD      Future Appointments            In 7 months Sagardia, Eilleen Kempf, MD Primary Care at [place], [facility]           . cetirizine (ZYRTEC) 10 MG tablet [Pharmacy Med Name: Cetirizine HCl 10 MG Oral Tablet] 30 tablet 0    Sig: Take 1 tablet by mouth once daily     Ear, Nose, and Throat:  Antihistamines Passed - 02/28/2020  8:42 AM      Passed - Valid encounter within last 12 months    Recent Outpatient Visits          3 weeks ago Sore throat   Primary Care at [facility], Eilleen Kempf, MD   4 months ago Sore throat   Primary Care at [place], Eilleen Kempf, MD   5 months ago Routine general medical examination at a health care facility   Primary Care at [place], Eilleen Kempf, MD   7 months ago Otalgia of right ear   Primary Care at [facility], Eilleen Kempf, MD   1 year ago Seasonal allergies   [facility] at [facility], Meda Coffee, MD      Future Appointments            In 7 months Sagardia, Eilleen Kempf, MD Primary Care at [place], [facility]

## 2020-04-19 ENCOUNTER — Other Ambulatory Visit: Payer: Self-pay | Admitting: Family Medicine

## 2020-05-25 ENCOUNTER — Other Ambulatory Visit: Payer: Self-pay

## 2020-05-25 ENCOUNTER — Encounter: Payer: Self-pay | Admitting: Emergency Medicine

## 2020-05-25 ENCOUNTER — Telehealth (INDEPENDENT_AMBULATORY_CARE_PROVIDER_SITE_OTHER): Payer: BC Managed Care – PPO | Admitting: Emergency Medicine

## 2020-05-25 VITALS — Ht 66.0 in | Wt 165.0 lb

## 2020-05-25 DIAGNOSIS — J029 Acute pharyngitis, unspecified: Secondary | ICD-10-CM | POA: Diagnosis not present

## 2020-05-25 MED ORDER — AZITHROMYCIN 250 MG PO TABS: ORAL_TABLET | ORAL | 0 refills | Status: DC

## 2020-05-25 NOTE — Patient Instructions (Signed)
° ° ° °  If you have lab work done today you will be contacted with your lab results within the next 2 weeks.  If you have not heard from us then please contact us. The fastest way to get your results is to register for My Chart. ° ° °IF you received an x-ray today, you will receive an invoice from [facility]. Please contact [facility] at [phone] with questions or concerns regarding your invoice.  ° °IF you received labwork today, you will receive an invoice from [facility]. Please contact [facility] at [phone] with questions or concerns regarding your invoice.  ° °Our billing staff will not be able to assist you with questions regarding bills from these companies. ° °You will be contacted with the lab results as soon as they are available. The fastest way to get your results is to activate your My Chart account. Instructions are located on the last page of this paperwork. If you have not heard from us regarding the results in 2 weeks, please contact this office. °  ° ° ° °

## 2020-05-25 NOTE — Progress Notes (Signed)
Telemedicine Encounter- SOAP NOTE Established Patient MyChart video conference This video telephone encounter was conducted with the patient's (or proxy's) verbal consent via video audio telecommunications: yes/no: Yes Patient was instructed to have this encounter in a suitably private space; and to only have persons present to whom they give permission to participate. In addition, patient identity was confirmed by use of name plus two identifiers (DOB and address).  I discussed the limitations, risks, security and privacy concerns of performing an evaluation and management service by telephone and the availability of in person appointments. I also discussed with the patient that there may be a patient responsible charge related to this service. The patient expressed understanding and agreed to proceed.  I spent a total of TIME; 0 MIN TO 60 MIN: 20 minutes talking with the patient or their proxy.  Chief Complaint  Patient presents with   Sore Throat    2 days    Generalized Body Aches    Subjective   William Owens is a 39 y.o. male established patient. Telephone visit today complaining of sore throat and body aches for 2 days.  Able to eat and drink.  Denies nausea or vomiting.  Denies abdominal pain or diarrhea.  Denies fever or chills.  Denies cough difficulty breathing or chest pain.  No known sick at home.  No other significant symptoms.  HPI   There are no problems to display for this patient.   Past Medical History:  Diagnosis Date   Allergy    Diverticulitis     Current Outpatient Medications  Medication Sig Dispense Refill   cetirizine (ZYRTEC) 10 MG tablet Take 1 tablet by mouth once daily 30 tablet 0   fluticasone (FLONASE) 50 MCG/ACT nasal spray Use 1 spray(s) in each nostril twice daily 16 g 0   No current facility-administered medications for this visit.    No Known Allergies  Social History   Socioeconomic History   Marital status:  Married    Spouse name: Not on file   Number of children: Not on file   Years of education: Not on file   Highest education level: Not on file  Occupational History   Not on file  Tobacco Use   Smoking status: Former Smoker   Smokeless tobacco: Never Used  Substance and Sexual Activity   Alcohol use: Yes    Alcohol/week: 1.0 - 2.0 standard drink    Types: 1 - 2 Standard drinks or equivalent per week    Comment: social   Drug use: No   Sexual activity: Not on file  Other Topics Concern   Not on file  Social History Narrative   Not on file   Social Determinants of Health   Financial Resource Strain:    Difficulty of Paying Living Expenses:   Food Insecurity:    Worried About Programme researcher, broadcasting/film/video in the Last Year:    Barista in the Last Year:   Transportation Needs:    Freight forwarder (Medical):    Lack of Transportation (Non-Medical):   Physical Activity:    Days of Exercise per Week:    Minutes of Exercise per Session:   Stress:    Feeling of Stress :   Social Connections:    Frequency of Communication with Friends and Family:    Frequency of Social Gatherings with Friends and Family:    Attends Religious Services:    Active Member of Clubs or Organizations:  Attends Banker Meetings:    Marital Status:   Intimate Partner Violence:    Fear of Current or Ex-Partner:    Emotionally Abused:    Physically Abused:    Sexually Abused:     Review of Systems  Constitutional: Negative.  Negative for chills and fever.  HENT: Positive for sore throat.   Respiratory: Negative.  Negative for cough and shortness of breath.   Cardiovascular: Negative.  Negative for chest pain and palpitations.  Gastrointestinal: Negative for abdominal pain, diarrhea, nausea and vomiting.  Genitourinary: Negative for dysuria and hematuria.  Musculoskeletal: Negative.  Negative for back pain, myalgias and neck pain.  Skin: Negative.   Negative for rash.  Neurological: Negative for dizziness and headaches.  All other systems reviewed and are negative.   Objective  Alert and oriented x3 in no apparent respiratory distress Vitals as reported by the patient: Today's Vitals   05/25/20 1646  Weight: 165 lb (74.8 kg)  Height: 5\' 6"  (1.676 m)    There are no diagnoses linked to this encounter.  [name] was seen today for sore throat and generalized body aches.  Diagnoses and all orders for this visit:  Sore throat  Acute pharyngitis, unspecified etiology -     azithromycin (ZITHROMAX) 250 MG tablet; Sig as indicated     I discussed the assessment and treatment plan with the patient. The patient was provided an opportunity to ask questions and all were answered. The patient agreed with the plan and demonstrated an understanding of the instructions.   The patient was advised to call back or seek an in-person evaluation if the symptoms worsen or if the condition fails to improve as anticipated.  I provided 20 minutes of non-face-to-face time during this encounter.  , MD  Primary Care at [facility]

## 2020-06-07 ENCOUNTER — Other Ambulatory Visit: Payer: Self-pay | Admitting: Emergency Medicine

## 2020-06-17 ENCOUNTER — Telehealth (INDEPENDENT_AMBULATORY_CARE_PROVIDER_SITE_OTHER): Payer: BC Managed Care – PPO | Admitting: Family Medicine

## 2020-06-17 ENCOUNTER — Encounter: Payer: Self-pay | Admitting: Family Medicine

## 2020-06-17 DIAGNOSIS — K219 Gastro-esophageal reflux disease without esophagitis: Secondary | ICD-10-CM | POA: Diagnosis not present

## 2020-06-17 DIAGNOSIS — F410 Panic disorder [episodic paroxysmal anxiety] without agoraphobia: Secondary | ICD-10-CM | POA: Diagnosis not present

## 2020-06-17 DIAGNOSIS — Z131 Encounter for screening for diabetes mellitus: Secondary | ICD-10-CM

## 2020-06-17 MED ORDER — FAMOTIDINE 20 MG PO TABS
20.0000 mg | ORAL_TABLET | Freq: Every day | ORAL | 1 refills | Status: DC | PRN
Start: 2020-06-17 — End: 2022-07-12

## 2020-06-17 MED ORDER — HYDROXYZINE HCL 25 MG PO TABS: 12.5000 mg | ORAL_TABLET | Freq: Three times a day (TID) | ORAL | 1 refills | Status: DC | PRN

## 2020-06-17 NOTE — Progress Notes (Signed)
Virtual Visit Note  I connected with patient on 06/17/20 at 528pm by video epic and verified that I am speaking with the correct person using two identifiers. William Owens is currently located at home and patient is currently with them during visit. The provider, Myles Lipps, MD is located in their office at time of visit.  I discussed the limitations, risks, security and privacy concerns of performing an evaluation and management service by telephone and the availability of in person appointments. I also discussed with the patient that there may be a patient responsible charge related to this service. The patient expressed understanding and agreed to proceed.   I provided 16 minutes of non-face-to-face time during this encounter.  Chief Complaint  Patient presents with  . panic attacks    panic attacks while driving [place] the mountains on vacation , and at beginning of meal break at work,  sweats, chest tightness,nervousness    HPI ? Patient reports that he was in usual state of health when suddenly started having panic attacks during family vacation a week ago They were high in the mountains and he has a fear of heights However, he has continued to have panic attacks (less intense) after returning to his home He is also having problems with reflux since then He denies any previous problems with anxiety He has fhx diabetes   No Known Allergies  Prior to Admission medications   Medication Sig Start Date End Date Taking? Authorizing Provider  cetirizine (ZYRTEC) 10 MG tablet Take 1 tablet by mouth once daily 06/07/20  Yes Sagardia, Eilleen Kempf, MD  fluticasone [facility]) 50 MCG/ACT nasal spray Use 1 spray(s) in each nostril twice daily 06/07/20  Yes Sagardia, Eilleen Kempf, MD    Past Medical History:  Diagnosis Date  . Allergy   . Diverticulitis     No past surgical history on file.  Social History   Tobacco Use  . Smoking status: Former Games developer  . Smokeless  tobacco: Never Used  Substance Use Topics  . Alcohol use: Yes    Alcohol/week: 1.0 - 2.0 standard drink    Types: 1 - 2 Standard drinks or equivalent per week    Comment: social    No family history on file.  ROS Per hpi  Objective  Vitals as reported by the patient: none  GEN: AAOx3, NAD HEENT: [place]/AT, pupils are symmetrical, EOMI, non-icteric sclera Resp: breathing comfortably, speaking in full sentences Skin: no rashes noted, no pallor Psych: good eye contact, normal mood and affect   ASSESSMENT and PLAN  1. Panic attacks rx for vistaril, reviewed r/se/b - TSH; Future  2. Gastroesophageal reflux disease without esophagitis rx for pepcid, discussed LFM  3. Screening for diabetes mellitus - Hemoglobin A1c; Future  Other orders - hydrOXYzine (ATARAX/VISTARIL) 25 MG tablet; Take 0.5-1 tablets (12.5-25 mg total) by mouth every 8 (eight) hours as needed for anxiety. - famotidine (PEPCID) 20 MG tablet; Take 1 tablet (20 mg total) by mouth daily as needed for heartburn or indigestion.  FOLLOW-UP: prn   The above assessment and management plan was discussed with the patient. The patient verbalized understanding of and has agreed to the management plan. Patient is aware to call the clinic if symptoms persist or worsen. Patient is aware when to return to the clinic for a follow-up visit. Patient educated on when it is appropriate to go to the emergency department.     Myles Lipps, MD Primary Care at [facility] [address]  [place], [place] [address] Ph.  [phone] Fax [phone]

## 2020-06-20 ENCOUNTER — Ambulatory Visit (INDEPENDENT_AMBULATORY_CARE_PROVIDER_SITE_OTHER): Payer: BC Managed Care – PPO | Admitting: Family Medicine

## 2020-06-20 ENCOUNTER — Other Ambulatory Visit: Payer: Self-pay

## 2020-06-20 DIAGNOSIS — F410 Panic disorder [episodic paroxysmal anxiety] without agoraphobia: Secondary | ICD-10-CM | POA: Diagnosis not present

## 2020-06-20 DIAGNOSIS — Z131 Encounter for screening for diabetes mellitus: Secondary | ICD-10-CM | POA: Diagnosis not present

## 2020-06-21 LAB — HEMOGLOBIN A1C
Est. average glucose Bld gHb Est-mCnc: 123 mg/dL
Hgb A1c MFr Bld: 5.9 % — ABNORMAL HIGH (ref 4.8–5.6)

## 2020-06-21 LAB — TSH: TSH: 1.19 u[IU]/mL (ref 0.450–4.500)

## 2020-07-12 ENCOUNTER — Encounter: Payer: Self-pay | Admitting: Radiology

## 2020-07-29 ENCOUNTER — Other Ambulatory Visit: Payer: Self-pay | Admitting: Emergency Medicine

## 2020-08-22 ENCOUNTER — Other Ambulatory Visit: Payer: Self-pay

## 2020-08-22 ENCOUNTER — Ambulatory Visit (HOSPITAL_COMMUNITY)
Admission: EM | Admit: 2020-08-22 | Discharge: 2020-08-22 | Disposition: A | Discharge status: Home / Self Care | Payer: BC Managed Care – PPO | Attending: Family Medicine | Admitting: Family Medicine

## 2020-08-22 ENCOUNTER — Encounter (HOSPITAL_COMMUNITY): Payer: Self-pay | Admitting: *Deleted

## 2020-08-22 DIAGNOSIS — H9201 Otalgia, right ear: Secondary | ICD-10-CM | POA: Insufficient documentation

## 2020-08-22 DIAGNOSIS — Z79899 Other long term (current) drug therapy: Secondary | ICD-10-CM | POA: Diagnosis not present

## 2020-08-22 DIAGNOSIS — Z20822 Contact with and (suspected) exposure to covid-19: Secondary | ICD-10-CM | POA: Diagnosis not present

## 2020-08-22 DIAGNOSIS — M546 Pain in thoracic spine: Secondary | ICD-10-CM | POA: Insufficient documentation

## 2020-08-22 DIAGNOSIS — Z87891 Personal history of nicotine dependence: Secondary | ICD-10-CM | POA: Insufficient documentation

## 2020-08-22 DIAGNOSIS — J302 Other seasonal allergic rhinitis: Secondary | ICD-10-CM | POA: Insufficient documentation

## 2020-08-22 DIAGNOSIS — J029 Acute pharyngitis, unspecified: Secondary | ICD-10-CM | POA: Insufficient documentation

## 2020-08-22 MED ORDER — FLUTICASONE PROPIONATE 50 MCG/ACT NA SUSP: 2.0000 | Freq: Every day | NASAL | 0 refills | Status: DC

## 2020-08-22 MED ORDER — CETIRIZINE HCL 10 MG PO TABS: 10.0000 mg | ORAL_TABLET | Freq: Every day | ORAL | 0 refills | Status: DC

## 2020-08-22 NOTE — ED Triage Notes (Signed)
PT reports sore throat started last Tuesday and Rt ear pain started on SAt.

## 2020-08-22 NOTE — Discharge Instructions (Signed)
Use of nasal spray likely will help with your ear discomfort, use daily.  Daily zyrtec to help with your symptoms.  If symptoms worsen or do not improve in the next week to return to be seen or to follow up with your primary care provider.

## 2020-08-22 NOTE — ED Provider Notes (Signed)
[facility]    CSN: [id number] Arrival date & time: 08/22/20  1851      History   Chief Complaint Chief Complaint  Patient presents with  . Otalgia    RT  . Sore Throat    HPI William Owens is a 39 y.o. male.   William Owens presents with complaints of right ear pain. Started two days ago.  No cough or runny nose. Some sore throat. Some white mucus production from throat. Similar in the past with change of weather. No fevers. Some upper back pain. Took tylenol yesterday which helped some. Some decreased hearing. No drainage from the ear. Tight sensation to the ear. His children have been ill.   Family provides some clarifying interpretation as needed.    ROS per HPI, negative if not otherwise mentioned.      Past Medical History:  Diagnosis Date  . Allergy   . Diverticulitis     There are no problems to display for this patient.   History reviewed. No pertinent surgical history.     Home Medications    Prior to Admission medications   Medication Sig Start Date End Date Taking? Authorizing Provider  cetirizine (ZYRTEC) 10 MG tablet Take 1 tablet (10 mg total) by mouth daily. 08/22/20   Georgetta Haber, NP  famotidine (PEPCID) 20 MG tablet Take 1 tablet (20 mg total) by mouth daily as needed for heartburn or indigestion. 06/17/20   Myles Lipps, MD  fluticasone (FLONASE) 50 MCG/ACT nasal spray Place 2 sprays into both nostrils daily. 08/22/20   Georgetta Haber, NP  hydrOXYzine (ATARAX/VISTARIL) 25 MG tablet Take 0.5-1 tablets (12.5-25 mg total) by mouth every 8 (eight) hours as needed for anxiety. 06/17/20   Myles Lipps, MD    Family History History reviewed. No pertinent family history.  Social History Social History   Tobacco Use  . Smoking status: Former Games developer  . Smokeless tobacco: Never Used  Substance Use Topics  . Alcohol use: Yes    Alcohol/week: 1.0 - 2.0 standard drink    Types: 1 - 2 Standard drinks or equivalent  per week    Comment: social  . Drug use: No     Allergies   Patient has no known allergies.   Review of Systems Review of Systems   Physical Exam Triage Vital Signs ED Triage Vitals  Enc Vitals Group     BP 08/22/20 2110 125/77     Pulse Rate 08/22/20 2110 61     Resp 08/22/20 2110 15     Temp 08/22/20 2110 98.4 F (36.9 C)     Temp Source 08/22/20 2110 Oral     SpO2 08/22/20 2110 100 %     Weight 08/22/20 2113 165 lb (74.8 kg)     Height 08/22/20 2113 5\' 6"  (1.676 m)     Head Circumference --      Peak Flow --      Pain Score 08/22/20 2113 0     Pain Loc --      Pain Edu? --      Excl. in GC? --    No data found.  Updated Vital Signs BP 125/77 (BP Location: Right Arm)   Pulse 61   Temp 98.4 F (36.9 C) (Oral)   Resp 15   Ht 5\' 6"  (1.676 m)   Wt 165 lb (74.8 kg)   SpO2 100%   BMI 26.63 kg/m   Visual Acuity Right Eye  Distance:   Left Eye Distance:   Bilateral Distance:    Right Eye Near:   Left Eye Near:    Bilateral Near:     Physical Exam Constitutional:      Appearance: He is well-developed.  HENT:     Right Ear: Tympanic membrane and ear canal normal.     Left Ear: Tympanic membrane and ear canal normal.     Mouth/Throat:     Tonsils: No tonsillar exudate.  Cardiovascular:     Rate and Rhythm: Normal rate.  Pulmonary:     Effort: Pulmonary effort is normal.  Skin:    General: Skin is warm and dry.  Neurological:     Mental Status: He is alert and oriented to person, place, and time.      UC Treatments / Results  Labs (all labs ordered are listed, but only abnormal results are displayed) Labs Reviewed  SARS CORONAVIRUS 2 (TAT 6-24 HRS)    EKG   Radiology No results found.  Procedures Procedures (including critical care time)  Medications Ordered in UC Medications - No data to display  Initial Impression / Assessment and Plan / UC Course  I have reviewed the triage vital signs and the nursing notes.  Pertinent labs &  imaging results that were available during my care of the patient were reviewed by me and considered in my medical decision making (see chart for details).     Non toxic. Benign physical exam.  Viral vs allergic in nature likely. Supportive cares recommended. Return precautions provided. Patient verbalized understanding and agreeable to plan.   Final Clinical Impressions(s) / UC Diagnoses   Final diagnoses:  Right ear pain  Seasonal allergies     Discharge Instructions     Use of nasal spray likely will help with your ear discomfort, use daily.  Daily zyrtec to help with your symptoms.  If symptoms worsen or do not improve in the next week to return to be seen or to follow up with your primary care provider.      ED Prescriptions    Medication Sig Dispense Auth. Provider   cetirizine (ZYRTEC) 10 MG tablet Take 1 tablet (10 mg total) by mouth daily. 30 tablet Linus Mako B, NP   fluticasone (FLONASE) 50 MCG/ACT nasal spray Place 2 sprays into both nostrils daily. 16 g Georgetta Haber, NP     PDMP not reviewed this encounter.   Georgetta Haber, NP 08/24/20 [phone]

## 2020-08-23 LAB — SARS CORONAVIRUS 2 (TAT 6-24 HRS): SARS Coronavirus 2: NEGATIVE

## 2020-09-23 ENCOUNTER — Encounter: Payer: BC Managed Care – PPO | Admitting: Emergency Medicine

## 2020-09-26 ENCOUNTER — Encounter: Payer: BC Managed Care – PPO | Admitting: Emergency Medicine

## 2020-12-05 ENCOUNTER — Encounter: Payer: Self-pay | Admitting: Emergency Medicine

## 2020-12-05 ENCOUNTER — Other Ambulatory Visit: Payer: Self-pay

## 2020-12-05 ENCOUNTER — Telehealth (INDEPENDENT_AMBULATORY_CARE_PROVIDER_SITE_OTHER): Payer: BC Managed Care – PPO | Admitting: Emergency Medicine

## 2020-12-05 VITALS — Ht 66.0 in | Wt 165.0 lb

## 2020-12-05 DIAGNOSIS — Z20822 Contact with and (suspected) exposure to covid-19: Secondary | ICD-10-CM | POA: Diagnosis not present

## 2020-12-05 DIAGNOSIS — J029 Acute pharyngitis, unspecified: Secondary | ICD-10-CM

## 2020-12-05 MED ORDER — FLUTICASONE PROPIONATE 50 MCG/ACT NA SUSP
2.0000 | Freq: Every day | NASAL | 11 refills | Status: DC
Start: 2020-12-05 — End: 2022-01-15

## 2020-12-05 MED ORDER — CETIRIZINE HCL 10 MG PO TABS
10.0000 mg | ORAL_TABLET | Freq: Every day | ORAL | 5 refills | Status: DC
Start: 2020-12-05 — End: 2023-03-04

## 2020-12-05 MED ORDER — AZITHROMYCIN 250 MG PO TABS: ORAL_TABLET | ORAL | 0 refills | Status: DC

## 2020-12-05 NOTE — Patient Instructions (Signed)
° ° ° °  If you have lab work done today you will be contacted with your lab results within the next 2 weeks.  If you have not heard from us then please contact us. The fastest way to get your results is to register for My Chart. ° ° °IF you received an x-ray today, you will receive an invoice from [facility]. Please contact [facility] at [phone] with questions or concerns regarding your invoice.  ° °IF you received labwork today, you will receive an invoice from [facility]. Please contact [facility] at [phone] with questions or concerns regarding your invoice.  ° °Our billing staff will not be able to assist you with questions regarding bills from these companies. ° °You will be contacted with the lab results as soon as they are available. The fastest way to get your results is to activate your My Chart account. Instructions are located on the last page of this paperwork. If you have not heard from us regarding the results in 2 weeks, please contact this office. °  ° ° ° °

## 2020-12-05 NOTE — Progress Notes (Signed)
Telemedicine Encounter- SOAP NOTE Established Patient Patient: Home  Provider: Office     This telephone encounter was conducted with the patient's (or proxy's) verbal consent via audio telecommunications: yes/no: Yes Patient was instructed to have this encounter in a suitably private space; and to only have persons present to whom they give permission to participate. In addition, patient identity was confirmed by use of name plus two identifiers (DOB and address).  I discussed the limitations, risks, security and privacy concerns of performing an evaluation and management service by telephone and the availability of in person appointments. I also discussed with the patient that there may be a patient responsible charge related to this service. The patient expressed understanding and agreed to proceed.  I spent a total of TIME; 0 MIN TO 60 MIN: 20 minutes talking with the patient or their proxy.  Chief Complaint  Patient presents with  . Sore Throat    Since sat   . Nasal Congestion    Child positive    Subjective   William Owens is a 40 y.o. male established patient. Telephone visit today complaining of sore throat for several days. 77 year old son just tested positive for COVID.  No other significant symptoms.  HPI   There are no problems to display for this patient.   Past Medical History:  Diagnosis Date  . Allergy   . Diverticulitis     Current Outpatient Medications  Medication Sig Dispense Refill  . azithromycin (ZITHROMAX) 250 MG tablet Sig as indicated 6 tablet 0  . famotidine (PEPCID) 20 MG tablet Take 1 tablet (20 mg total) by mouth daily as needed for heartburn or indigestion. 30 tablet 1  . cetirizine (ZYRTEC) 10 MG tablet Take 1 tablet (10 mg total) by mouth daily. 30 tablet 5  . fluticasone (FLONASE) 50 MCG/ACT nasal spray Place 2 sprays into both nostrils daily. 16 g 11  . hydrOXYzine (ATARAX/VISTARIL) 25 MG tablet Take 0.5-1 tablets (12.5-25 mg  total) by mouth every 8 (eight) hours as needed for anxiety. (Patient not taking: Reported on 12/05/2020) 30 tablet 1   No current facility-administered medications for this visit.    No Known Allergies  Social History   Socioeconomic History  . Marital status: Married    Spouse name: Not on file  . Number of children: Not on file  . Years of education: Not on file  . Highest education level: Not on file  Occupational History  . Not on file  Tobacco Use  . Smoking status: Former Games developer  . Smokeless tobacco: Never Used  Substance and Sexual Activity  . Alcohol use: Yes    Alcohol/week: 1.0 - 2.0 standard drink    Types: 1 - 2 Standard drinks or equivalent per week    Comment: social  . Drug use: No  . Sexual activity: Not on file  Other Topics Concern  . Not on file  Social History Narrative  . Not on file   Social Determinants of Health   Financial Resource Strain: Not on file  Food Insecurity: Not on file  Transportation Needs: Not on file  Physical Activity: Not on file  Stress: Not on file  Social Connections: Not on file  Intimate Partner Violence: Not on file    Review of Systems  Constitutional: Negative.  Negative for chills and fever.  HENT: Positive for congestion and sore throat.   Respiratory: Negative for cough and shortness of breath.   Cardiovascular: Negative.  Negative for chest  pain and palpitations.  Gastrointestinal: Negative for abdominal pain, diarrhea, nausea and vomiting.  Genitourinary: Negative.  Negative for dysuria and hematuria.  Skin: Negative.  Negative for rash.  Neurological: Negative.  Negative for dizziness and headaches.  All other systems reviewed and are negative.   Objective   Vitals as reported by the patient: Today's Vitals   12/05/20 1550  Weight: 165 lb (74.8 kg)  Height: 5\' 6"  (1.676 m)    There are no diagnoses linked to this encounter.  William Owens was seen today for sore throat and nasal  congestion.  Diagnoses and all orders for this visit:  Sore throat  Exposure to COVID-19 virus  Other orders -     cetirizine (ZYRTEC) 10 MG tablet; Take 1 tablet (10 mg total) by mouth daily. -     fluticasone (FLONASE) 50 MCG/ACT nasal spray; Place 2 sprays into both nostrils daily. -     azithromycin (ZITHROMAX) 250 MG tablet; Sig as indicated     I discussed the assessment and treatment plan with the patient. The patient was provided an opportunity to ask questions and all were answered. The patient agreed with the plan and demonstrated an understanding of the instructions.   The patient was advised to call back or seek an in-person evaluation if the symptoms worsen or if the condition fails to improve as anticipated.  I provided 20 minutes of non-face-to-face time during this encounter.  , MD  Primary Care at [facility]

## 2020-12-06 ENCOUNTER — Encounter: Payer: BC Managed Care – PPO | Admitting: Emergency Medicine

## 2021-05-30 ENCOUNTER — Telehealth (INDEPENDENT_AMBULATORY_CARE_PROVIDER_SITE_OTHER): Payer: BC Managed Care – PPO | Admitting: Emergency Medicine

## 2021-05-30 ENCOUNTER — Encounter: Payer: Self-pay | Admitting: Emergency Medicine

## 2021-05-30 DIAGNOSIS — J988 Other specified respiratory disorders: Secondary | ICD-10-CM | POA: Diagnosis not present

## 2021-05-30 DIAGNOSIS — R6889 Other general symptoms and signs: Secondary | ICD-10-CM | POA: Diagnosis not present

## 2021-05-30 DIAGNOSIS — B9789 Other viral agents as the cause of diseases classified elsewhere: Secondary | ICD-10-CM

## 2021-05-30 NOTE — Progress Notes (Signed)
Telemedicine Encounter- SOAP NOTE Established Patient Patient: Home  Provider: Office   Patient present only MyChart video attempted without success.  Invitation was sent by me. This telephone encounter was conducted with the patient's (or proxy's) verbal consent via audio telecommunications: yes/no: Yes Patient was instructed to have this encounter in a suitably private space; and to only have persons present to whom they give permission to participate. In addition, patient identity was confirmed by use of name plus two identifiers (DOB and address).  I discussed the limitations, risks, security and privacy concerns of performing an evaluation and management service by telephone and the availability of in person appointments. I also discussed with the patient that there may be a patient responsible charge related to this service. The patient expressed understanding and agreed to proceed.  I spent a total of TIME; 0 MIN TO 60 MIN: 20 minutes talking with the patient or their proxy.  Chief complaint: Sore throat  Subjective   William Owens is a 40 y.o. male established patient. Telephone visit today complaining of flulike symptoms with fever chills and sore throat that started 4 to 5 days ago.  Feeling about 50% better today.  Able to eat and drink.  Denies nausea or vomiting.  Denies abdominal pain or diarrhea.  Denies any other associated significant symptoms.  HPI   There are no problems to display for this patient.   Past Medical History:  Diagnosis Date   Allergy    Diverticulitis     Current Outpatient Medications  Medication Sig Dispense Refill   azithromycin (ZITHROMAX) 250 MG tablet Sig as indicated 6 tablet 0   cetirizine (ZYRTEC) 10 MG tablet Take 1 tablet (10 mg total) by mouth daily. 30 tablet 5   famotidine (PEPCID) 20 MG tablet Take 1 tablet (20 mg total) by mouth daily as needed for heartburn or indigestion. 30 tablet 1   fluticasone (FLONASE) 50 MCG/ACT  nasal spray Place 2 sprays into both nostrils daily. 16 g 11   hydrOXYzine (ATARAX/VISTARIL) 25 MG tablet Take 0.5-1 tablets (12.5-25 mg total) by mouth every 8 (eight) hours as needed for anxiety. (Patient not taking: Reported on 12/05/2020) 30 tablet 1   No current facility-administered medications for this visit.    No Known Allergies  Social History   Socioeconomic History   Marital status: Married    Spouse name: Not on file   Number of children: Not on file   Years of education: Not on file   Highest education level: Not on file  Occupational History   Not on file  Tobacco Use   Smoking status: Former    Pack years: 0.00   Smokeless tobacco: Never  Substance and Sexual Activity   Alcohol use: Yes    Alcohol/week: 1.0 - 2.0 standard drink    Types: 1 - 2 Standard drinks or equivalent per week    Comment: social   Drug use: No   Sexual activity: Not on file  Other Topics Concern   Not on file  Social History Narrative   Not on file   Social Determinants of Health   Financial Resource Strain: Not on file  Food Insecurity: Not on file  Transportation Needs: Not on file  Physical Activity: Not on file  Stress: Not on file  Social Connections: Not on file  Intimate Partner Violence: Not on file    Review of Systems  Constitutional:  Positive for chills and fever. Negative for malaise/fatigue.  HENT:  Positive for congestion and sore throat. Negative for ear pain.   Respiratory: Negative.  Negative for cough and shortness of breath.   Cardiovascular: Negative.  Negative for chest pain and palpitations.  Gastrointestinal:  Negative for abdominal pain, diarrhea, nausea and vomiting.  Genitourinary: Negative.  Negative for dysuria.  Musculoskeletal:  Negative for back pain, myalgias and neck pain.  Skin: Negative.  Negative for rash.  Neurological: Negative.  Negative for dizziness and headaches.  All other systems reviewed and are negative.  Objective  Alert and  oriented x3 in no apparent respiratory distress Vitals as reported by the patient: There were no vitals filed for this visit.  There are no diagnoses linked to this encounter. Clinically stable.  No red flag signs or symptoms.  Viral illness running its course. Advised to stay well-hydrated and increase daily caloric intake.  Activities as tolerated. Advised to contact the office if no better or worse during the next 2 to 3 days. Diagnoses and all orders for this visit:  Viral respiratory illness  Flu-like symptoms    I discussed the assessment and treatment plan with the patient. The patient was provided an opportunity to ask questions and all were answered. The patient agreed with the plan and demonstrated an understanding of the instructions.   The patient was advised to call back or seek an in-person evaluation if the symptoms worsen or if the condition fails to improve as anticipated.  I provided 20 minutes of non-face-to-face time during this encounter.  Georgina Quint, MD  [facility]

## 2021-06-15 ENCOUNTER — Ambulatory Visit (INDEPENDENT_AMBULATORY_CARE_PROVIDER_SITE_OTHER): Payer: BC Managed Care – PPO | Admitting: Emergency Medicine

## 2021-06-15 ENCOUNTER — Other Ambulatory Visit: Payer: Self-pay

## 2021-06-15 ENCOUNTER — Encounter: Payer: Self-pay | Admitting: Emergency Medicine

## 2021-06-15 VITALS — BP 122/76 | HR 71 | Temp 97.7°F | Ht 66.0 in | Wt 164.0 lb

## 2021-06-15 DIAGNOSIS — R7303 Prediabetes: Secondary | ICD-10-CM

## 2021-06-15 DIAGNOSIS — Z13228 Encounter for screening for other metabolic disorders: Secondary | ICD-10-CM

## 2021-06-15 DIAGNOSIS — Z1329 Encounter for screening for other suspected endocrine disorder: Secondary | ICD-10-CM | POA: Diagnosis not present

## 2021-06-15 DIAGNOSIS — Z1159 Encounter for screening for other viral diseases: Secondary | ICD-10-CM | POA: Diagnosis not present

## 2021-06-15 DIAGNOSIS — Z8659 Personal history of other mental and behavioral disorders: Secondary | ICD-10-CM

## 2021-06-15 DIAGNOSIS — Z130 Encounter for screening for diseases of the blood and blood-forming organs and certain disorders involving the immune mechanism: Secondary | ICD-10-CM | POA: Diagnosis not present

## 2021-06-15 DIAGNOSIS — Z13220 Encounter for screening for lipoid disorders: Secondary | ICD-10-CM | POA: Diagnosis not present

## 2021-06-15 DIAGNOSIS — Z0000 Encounter for general adult medical examination without abnormal findings: Secondary | ICD-10-CM | POA: Diagnosis not present

## 2021-06-15 LAB — CBC WITH DIFFERENTIAL/PLATELET
Basophils Absolute: 0 10*3/uL (ref 0.0–0.1)
Basophils Relative: 0.5 % (ref 0.0–3.0)
Eosinophils Absolute: 0 10*3/uL (ref 0.0–0.7)
Eosinophils Relative: 0.6 % (ref 0.0–5.0)
HCT: 45.9 % (ref 39.0–52.0)
Hemoglobin: 15.1 g/dL (ref 13.0–17.0)
Lymphocytes Relative: 28.9 % (ref 12.0–46.0)
Lymphs Abs: 1.4 10*3/uL (ref 0.7–4.0)
MCHC: 33 g/dL (ref 30.0–36.0)
MCV: 92.7 fl (ref 78.0–100.0)
Monocytes Absolute: 0.4 10*3/uL (ref 0.1–1.0)
Monocytes Relative: 7.8 % (ref 3.0–12.0)
Neutro Abs: 2.9 10*3/uL (ref 1.4–7.7)
Neutrophils Relative %: 62.2 % (ref 43.0–77.0)
Platelets: 195 10*3/uL (ref 150.0–400.0)
RBC: 4.95 Mil/uL (ref 4.22–5.81)
RDW: 14.1 % (ref 11.5–15.5)
WBC: 4.7 10*3/uL (ref 4.0–10.5)

## 2021-06-15 LAB — COMPREHENSIVE METABOLIC PANEL
ALT: 17 U/L (ref 0–53)
AST: 20 U/L (ref 0–37)
Albumin: 4.6 g/dL (ref 3.5–5.2)
Alkaline Phosphatase: 89 U/L (ref 39–117)
BUN: 12 mg/dL (ref 6–23)
CO2: 28 mEq/L (ref 19–32)
Calcium: 9.5 mg/dL (ref 8.4–10.5)
Chloride: 103 mEq/L (ref 96–112)
Creatinine, Ser: 0.8 mg/dL (ref 0.40–1.50)
GFR: 110.56 mL/min (ref >60.00)
Glucose, Bld: 106 mg/dL — ABNORMAL HIGH (ref 70–99)
Potassium: 4 mEq/L (ref 3.5–5.1)
Sodium: 139 mEq/L (ref 135–145)
Total Bilirubin: 0.5 mg/dL (ref 0.2–1.2)
Total Protein: 7.4 g/dL (ref 6.0–8.3)

## 2021-06-15 LAB — LIPID PANEL
Cholesterol: 172 mg/dL (ref 0–200)
HDL: 48.1 mg/dL (ref >39.00)
LDL Cholesterol: 112 mg/dL — ABNORMAL HIGH (ref 0–99)
NonHDL: 123.89
Total CHOL/HDL Ratio: 4
Triglycerides: 58 mg/dL (ref 0.0–149.0)
VLDL: 11.6 mg/dL (ref 0.0–40.0)

## 2021-06-15 LAB — HEMOGLOBIN A1C: Hgb A1c MFr Bld: 6.2 % (ref 4.6–6.5)

## 2021-06-15 NOTE — Progress Notes (Signed)
William Owens 40 y.o.   Chief Complaint  Patient presents with   Annual Exam    HISTORY OF PRESENT ILLNESS: This is a 40 y.o. male here for annual exam. Healthy male with a healthy lifestyle. Has history of prediabetes. Also has history of frequent panic attacks. No other complaints or medical concerns today.  HPI   Prior to Admission medications   Medication Sig Start Date End Date Taking? Authorizing Provider  cetirizine (ZYRTEC) 10 MG tablet Take 1 tablet (10 mg total) by mouth daily. 12/05/20  Yes Simon Aaberg, Eilleen KempfMiguel Jose, MD  famotidine (PEPCID) 20 MG tablet Take 1 tablet (20 mg total) by mouth daily as needed for heartburn or indigestion. 06/17/20  Yes Lezlie LyeSantiago Lago, Irma M, MD  fluticasone [facility](FLONASE) 50 MCG/ACT nasal spray Place 2 sprays into both nostrils daily. 12/05/20  Yes Maykayla Highley, Eilleen KempfMiguel Jose, MD  hydrOXYzine (ATARAX/VISTARIL) 25 MG tablet Take 0.5-1 tablets (12.5-25 mg total) by mouth every 8 (eight) hours as needed for anxiety. 06/17/20  Yes Lezlie LyeSantiago Lago, Meda CoffeeIrma M, MD  azithromycin [facility](ZITHROMAX) 250 MG tablet Sig as indicated Patient not taking: Reported on 06/15/2021 12/05/20   Georgina QuintSagardia, Tyrrell Stephens Jose, MD    No Known Allergies  There are no problems to display for this patient.   Past Medical History:  Diagnosis Date   Allergy    Diverticulitis     History reviewed. No pertinent surgical history.  Social History   Socioeconomic History   Marital status: Married    Spouse name: Not on file   Number of children: Not on file   Years of education: Not on file   Highest education level: Not on file  Occupational History   Not on file  Tobacco Use   Smoking status: Former   Smokeless tobacco: Never  Substance and Sexual Activity   Alcohol use: Yes    Alcohol/week: 1.0 - 2.0 standard drink    Types: 1 - 2 Standard drinks or equivalent per week    Comment: social   Drug use: No   Sexual activity: Not on file  Other Topics Concern   Not on file  Social  History Narrative   Not on file   Social Determinants of Health   Financial Resource Strain: Not on file  Food Insecurity: Not on file  Transportation Needs: Not on file  Physical Activity: Not on file  Stress: Not on file  Social Connections: Not on file  Intimate Partner Violence: Not on file    History reviewed. No pertinent family history.   Review of Systems  Constitutional:  Negative for chills and fever.  HENT: Negative.  Negative for congestion and sore throat.   Respiratory: Negative.  Negative for cough and shortness of breath.   Cardiovascular:  Negative for chest pain and palpitations.  Gastrointestinal:  Negative for abdominal pain, blood in stool, melena, nausea and vomiting.  Genitourinary: Negative.  Negative for dysuria and hematuria.  Musculoskeletal: Negative.   Skin: Negative.  Negative for rash.  Neurological:  Negative for dizziness and headaches.  Psychiatric/Behavioral:  The patient is nervous/anxious (Panic attacks).   All other systems reviewed and are negative. Vitals:   06/15/21 0907  BP: 122/76  Pulse: 71  Temp: 97.7 F (36.5 C)  SpO2: 98%     Physical Exam Vitals reviewed.  Constitutional:      Appearance: Normal appearance.  HENT:     Head: Normocephalic.     Right Ear: Tympanic membrane, ear canal and external ear normal.  Left Ear: Tympanic membrane, ear canal and external ear normal.     Nose: Nose normal.     Mouth/Throat:     Mouth: Mucous membranes are moist.     Pharynx: Oropharynx is clear.  Eyes:     Extraocular Movements: Extraocular movements intact.     Conjunctiva/sclera: Conjunctivae normal.     Pupils: Pupils are equal, round, and reactive to light.  Neck:     Vascular: No carotid bruit.  Cardiovascular:     Rate and Rhythm: Normal rate and regular rhythm.     Pulses: Normal pulses.     Heart sounds: Normal heart sounds.  Pulmonary:     Effort: Pulmonary effort is normal.     Breath sounds: Normal breath  sounds.  Abdominal:     General: Bowel sounds are normal. There is no distension.     Palpations: Abdomen is soft. There is no mass.     Tenderness: There is no abdominal tenderness.  Musculoskeletal:        General: Normal range of motion.     Cervical back: Normal range of motion and neck supple. No tenderness.     Right lower leg: No edema.     Left lower leg: No edema.  Lymphadenopathy:     Cervical: No cervical adenopathy.  Skin:    General: Skin is warm and dry.  Neurological:     General: No focal deficit present.     Mental Status: He is alert and oriented to person, place, and time.  Psychiatric:        Mood and Affect: Mood normal.        Behavior: Behavior normal.     ASSESSMENT & PLAN: William Owens was seen today for annual exam.  Diagnoses and all orders for this visit:  Routine general medical examination at a health care facility  Need for hepatitis C screening test -     Hepatitis C antibody screen  Screening for deficiency anemia -     CBC with Differential  Screening for lipoid disorders -     Lipid panel  Screening for endocrine, metabolic and immunity disorder -     Comprehensive metabolic panel -     Hemoglobin A1c  Prediabetes  History of panic attacks -     Ambulatory referral to Psychiatry  Modifiable risk factors discussed with patient. Anticipatory guidance according to age provided. The following topics were also discussed: Social Determinants of Health Smoking Diet and nutrition Benefits of exercise Vaccinations recommendations Cardiovascular risk assessment Mental health including depression and anxiety Discussion of panic attacks/panic disorder, management, and benefits of psychiatric evaluation Fall and accident prevention  Patient Instructions  Health Maintenance, Male Adopting a healthy lifestyle and getting preventive care are important in promoting health and wellness. Ask your health care provider about: The right  schedule for you to have regular tests and exams. Things you can do on your own to prevent diseases and keep yourself healthy. What should I know about diet, weight, and exercise? Eat a healthy diet  Eat a diet that includes plenty of vegetables, fruits, low-fat dairy products, and lean protein. Do not eat a lot of foods that are high in solid fats, added sugars, or sodium.  Maintain a healthy weight Body mass index (BMI) is a measurement that can be used to identify possible weight problems. It estimates body fat based on height and weight. Your health care provider can help determine your BMI and help you achieve or maintain  ahealthy weight. Get regular exercise Get regular exercise. This is one of the most important things you can do for your health. Most adults should: Exercise for at least 150 minutes each week. The exercise should increase your heart rate and make you sweat (moderate-intensity exercise). Do strengthening exercises at least twice a week. This is in addition to the moderate-intensity exercise. Spend less time sitting. Even light physical activity can be beneficial. Watch cholesterol and blood lipids Have your blood tested for lipids and cholesterol at 41 years of age, then havethis test every 5 years. You may need to have your cholesterol levels checked more often if: Your lipid or cholesterol levels are high. You are older than 41 years of age. You are at high risk for heart disease. What should I know about cancer screening? Many types of cancers can be detected early and may often be prevented. Depending on your health history and family history, you may need to have cancer screening at various ages. This may include screening for: Colorectal cancer. Prostate cancer. Skin cancer. Lung cancer. What should I know about heart disease, diabetes, and high blood pressure? Blood pressure and heart disease High blood pressure causes heart disease and increases the risk of  stroke. This is more likely to develop in people who have high blood pressure readings, are of African descent, or are overweight. Talk with your health care provider about your target blood pressure readings. Have your blood pressure checked: Every 3-5 years if you are 36-80 years of age. Every year if you are 31 years old or older. If you are between the ages of 2 and 71 and are a current or former smoker, ask your health care provider if you should have a one-time screening for abdominal aortic aneurysm (AAA). Diabetes Have regular diabetes screenings. This checks your fasting blood sugar level. Have the screening done: Once every three years after age 30 if you are at a normal weight and have a low risk for diabetes. More often and at a younger age if you are overweight or have a high risk for diabetes. What should I know about preventing infection? Hepatitis B If you have a higher risk for hepatitis B, you should be screened for this virus. Talk with your health care provider to find out if you are at risk forhepatitis B infection. Hepatitis C Blood testing is recommended for: Everyone born from [birth year] through 1966. Anyone with known risk factors for hepatitis C. Sexually transmitted infections (STIs) You should be screened each year for STIs, including gonorrhea and chlamydia, if: You are sexually active and are younger than 41 years of age. You are older than 41 years of age and your health care provider tells you that you are at risk for this type of infection. Your sexual activity has changed since you were last screened, and you are at increased risk for chlamydia or gonorrhea. Ask your health care provider if you are at risk. Ask your health care provider about whether you are at high risk for HIV. Your health care provider may recommend a prescription medicine to help prevent HIV infection. If you choose to take medicine to prevent HIV, you should first get tested for HIV. You should  then be tested every 3 months for as long as you are taking the medicine. Follow these instructions at home: Lifestyle Do not use any products that contain nicotine or tobacco, such as cigarettes, e-cigarettes, and chewing tobacco. If you need help quitting, ask your health  care provider. Do not use street drugs. Do not share needles. Ask your health care provider for help if you need support or information about quitting drugs. Alcohol use Do not drink alcohol if your health care provider tells you not to drink. If you drink alcohol: Limit how much you have to 0-2 drinks a day. Be aware of how much alcohol is in your drink. In the [place] one drink equals one 12 oz bottle of beer (355 mL), one 5 oz glass of wine (148 mL), or one 1 oz glass of hard liquor (44 mL). General instructions Schedule regular health, dental, and eye exams. Stay current with your vaccines. Tell your health care provider if: You often feel depressed. You have ever been abused or do not feel safe at home. Summary Adopting a healthy lifestyle and getting preventive care are important in promoting health and wellness. Follow your health care provider's instructions about healthy diet, exercising, and getting tested or screened for diseases. Follow your health care provider's instructions on monitoring your cholesterol and blood pressure. This information is not intended to replace advice given to you by your health care provider. Make sure you discuss any questions you have with your healthcare provider. Document Revised: 11/05/2018 Document Reviewed: 11/05/2018 Elsevier Patient Education  2022 Elsevier Inc.   Edwina Barth, MD [place] [facility]

## 2021-06-15 NOTE — Patient Instructions (Signed)
Health Maintenance, Male Adopting a healthy lifestyle and getting preventive care are important in promoting health and wellness. Ask your health care provider about: The right schedule for you to have regular tests and exams. Things you can do on your own to prevent diseases and keep yourself healthy. What should I know about diet, weight, and exercise? Eat a healthy diet  Eat a diet that includes plenty of vegetables, fruits, low-fat dairy products, and lean protein. Do not eat a lot of foods that are high in solid fats, added sugars, or sodium.  Maintain a healthy weight Body mass index (BMI) is a measurement that can be used to identify possible weight problems. It estimates body fat based on height and weight. Your health care provider can help determine your BMI and help you achieve or maintain ahealthy weight. Get regular exercise Get regular exercise. This is one of the most important things you can do for your health. Most adults should: Exercise for at least 150 minutes each week. The exercise should increase your heart rate and make you sweat (moderate-intensity exercise). Do strengthening exercises at least twice a week. This is in addition to the moderate-intensity exercise. Spend less time sitting. Even light physical activity can be beneficial. Watch cholesterol and blood lipids Have your blood tested for lipids and cholesterol at 41 years of age, then havethis test every 5 years. You may need to have your cholesterol levels checked more often if: Your lipid or cholesterol levels are high. You are older than 40 years of age. You are at high risk for heart disease. What should I know about cancer screening? Many types of cancers can be detected early and may often be prevented. Depending on your health history and family history, you may need to have cancer screening at various ages. This may include screening for: Colorectal cancer. Prostate cancer. Skin cancer. Lung  cancer. What should I know about heart disease, diabetes, and high blood pressure? Blood pressure and heart disease High blood pressure causes heart disease and increases the risk of stroke. This is more likely to develop in people who have high blood pressure readings, are of African descent, or are overweight. Talk with your health care provider about your target blood pressure readings. Have your blood pressure checked: Every 3-5 years if you are 18-39 years of age. Every year if you are 40 years old or older. If you are between the ages of 65 and 75 and are a current or former smoker, ask your health care provider if you should have a one-time screening for abdominal aortic aneurysm (AAA). Diabetes Have regular diabetes screenings. This checks your fasting blood sugar level. Have the screening done: Once every three years after age 45 if you are at a normal weight and have a low risk for diabetes. More often and at a younger age if you are overweight or have a high risk for diabetes. What should I know about preventing infection? Hepatitis B If you have a higher risk for hepatitis B, you should be screened for this virus. Talk with your health care provider to find out if you are at risk forhepatitis B infection. Hepatitis C Blood testing is recommended for: Everyone born from 1945 through 1965. Anyone with known risk factors for hepatitis C. Sexually transmitted infections (STIs) You should be screened each year for STIs, including gonorrhea and chlamydia, if: You are sexually active and are younger than 41 years of age. You are older than 41 years of age   and your health care provider tells you that you are at risk for this type of infection. Your sexual activity has changed since you were last screened, and you are at increased risk for chlamydia or gonorrhea. Ask your health care provider if you are at risk. Ask your health care provider about whether you are at high risk for HIV.  Your health care provider may recommend a prescription medicine to help prevent HIV infection. If you choose to take medicine to prevent HIV, you should first get tested for HIV. You should then be tested every 3 months for as long as you are taking the medicine. Follow these instructions at home: Lifestyle Do not use any products that contain nicotine or tobacco, such as cigarettes, e-cigarettes, and chewing tobacco. If you need help quitting, ask your health care provider. Do not use street drugs. Do not share needles. Ask your health care provider for help if you need support or information about quitting drugs. Alcohol use Do not drink alcohol if your health care provider tells you not to drink. If you drink alcohol: Limit how much you have to 0-2 drinks a day. Be aware of how much alcohol is in your drink. In the [place] one drink equals one 12 oz bottle of beer (355 mL), one 5 oz glass of wine (148 mL), or one 1 oz glass of hard liquor (44 mL). General instructions Schedule regular health, dental, and eye exams. Stay current with your vaccines. Tell your health care provider if: You often feel depressed. You have ever been abused or do not feel safe at home. Summary Adopting a healthy lifestyle and getting preventive care are important in promoting health and wellness. Follow your health care provider's instructions about healthy diet, exercising, and getting tested or screened for diseases. Follow your health care provider's instructions on monitoring your cholesterol and blood pressure. This information is not intended to replace advice given to you by your health care provider. Make sure you discuss any questions you have with your healthcare provider. Document Revised: 11/05/2018 Document Reviewed: 11/05/2018 Elsevier Patient Education  2022 [employer] Inc.  

## 2021-06-16 LAB — HEPATITIS C ANTIBODY
Hepatitis C Ab: NONREACTIVE
SIGNAL TO CUT-OFF: 0.01 (ref <1.00)

## 2021-12-31 DIAGNOSIS — Z20822 Contact with and (suspected) exposure to covid-19: Secondary | ICD-10-CM | POA: Diagnosis not present

## 2021-12-31 DIAGNOSIS — R509 Fever, unspecified: Secondary | ICD-10-CM | POA: Diagnosis not present

## 2021-12-31 DIAGNOSIS — J039 Acute tonsillitis, unspecified: Secondary | ICD-10-CM | POA: Diagnosis not present

## 2022-01-14 ENCOUNTER — Other Ambulatory Visit: Payer: Self-pay | Admitting: Emergency Medicine

## 2022-07-12 ENCOUNTER — Ambulatory Visit: Payer: BC Managed Care – PPO | Admitting: Internal Medicine

## 2022-07-12 ENCOUNTER — Encounter: Payer: Self-pay | Admitting: Internal Medicine

## 2022-07-12 DIAGNOSIS — R110 Nausea: Secondary | ICD-10-CM | POA: Insufficient documentation

## 2022-07-12 DIAGNOSIS — R1031 Right lower quadrant pain: Secondary | ICD-10-CM | POA: Insufficient documentation

## 2022-07-12 LAB — URINALYSIS
Bilirubin Urine: NEGATIVE
Hgb urine dipstick: NEGATIVE
Ketones, ur: NEGATIVE
Leukocytes,Ua: NEGATIVE
Nitrite: NEGATIVE
Specific Gravity, Urine: <=1.005 — AB (ref 1.000–1.030)
Total Protein, Urine: NEGATIVE
Urine Glucose: NEGATIVE
Urobilinogen, UA: 0.2 (ref 0.0–1.0)
pH: 7 (ref 5.0–8.0)

## 2022-07-12 LAB — COMPREHENSIVE METABOLIC PANEL
ALT: 16 U/L (ref 0–53)
AST: 19 U/L (ref 0–37)
Albumin: 4.5 g/dL (ref 3.5–5.2)
Alkaline Phosphatase: 83 U/L (ref 39–117)
BUN: 12 mg/dL (ref 6–23)
CO2: 29 mEq/L (ref 19–32)
Calcium: 9.6 mg/dL (ref 8.4–10.5)
Chloride: 102 mEq/L (ref 96–112)
Creatinine, Ser: 0.82 mg/dL (ref 0.40–1.50)
GFR: 108.92 mL/min (ref >60.00)
Glucose, Bld: 110 mg/dL — ABNORMAL HIGH (ref 70–99)
Potassium: 3.6 mEq/L (ref 3.5–5.1)
Sodium: 138 mEq/L (ref 135–145)
Total Bilirubin: 0.6 mg/dL (ref 0.2–1.2)
Total Protein: 7.7 g/dL (ref 6.0–8.3)

## 2022-07-12 LAB — CBC WITH DIFFERENTIAL/PLATELET
Basophils Absolute: 0 10*3/uL (ref 0.0–0.1)
Basophils Relative: 0.2 % (ref 0.0–3.0)
Eosinophils Absolute: 0 10*3/uL (ref 0.0–0.7)
Eosinophils Relative: 0.2 % (ref 0.0–5.0)
HCT: 44.1 % (ref 39.0–52.0)
Hemoglobin: 14.7 g/dL (ref 13.0–17.0)
Lymphocytes Relative: 20.3 % (ref 12.0–46.0)
Lymphs Abs: 2.8 10*3/uL (ref 0.7–4.0)
MCHC: 33.4 g/dL (ref 30.0–36.0)
MCV: 92.3 fl (ref 78.0–100.0)
Monocytes Absolute: 1.3 10*3/uL — ABNORMAL HIGH (ref 0.1–1.0)
Monocytes Relative: 9.2 % (ref 3.0–12.0)
Neutro Abs: 9.6 10*3/uL — ABNORMAL HIGH (ref 1.4–7.7)
Neutrophils Relative %: 70.1 % (ref 43.0–77.0)
Platelets: 188 10*3/uL (ref 150.0–400.0)
RBC: 4.78 Mil/uL (ref 4.22–5.81)
RDW: 14.3 % (ref 11.5–15.5)
WBC: 13.7 10*3/uL — ABNORMAL HIGH (ref 4.0–10.5)

## 2022-07-12 MED ORDER — ONDANSETRON 4 MG PO TBDP: 4.0000 mg | ORAL_TABLET | Freq: Three times a day (TID) | ORAL | 0 refills | Status: DC | PRN

## 2022-07-12 NOTE — Assessment & Plan Note (Signed)
Zofran IM given Zofran PO

## 2022-07-12 NOTE — Progress Notes (Addendum)
Subjective:  Patient ID: DENZELL COLASANTI, male    DOB: 01-20-1981  Age: 41 y.o. MRN: [id number]  CC: No chief complaint on file.   HPI Randell D Hemphill presents for abd pain x 2 days, he slept poorly last night, nauseated. Stool was nl this am, no vomiting. Possible low grade fever...  Outpatient Medications Prior to Visit  Medication Sig Dispense Refill   cetirizine (ZYRTEC) 10 MG tablet Take 1 tablet (10 mg total) by mouth daily. 30 tablet 5   fluticasone (FLONASE) 50 MCG/ACT nasal spray Use 2 spray(s) in each nostril once daily 16 g 0   famotidine (PEPCID) 20 MG tablet Take 1 tablet (20 mg total) by mouth daily as needed for heartburn or indigestion. 30 tablet 1   hydrOXYzine (ATARAX/VISTARIL) 25 MG tablet Take 0.5-1 tablets (12.5-25 mg total) by mouth every 8 (eight) hours as needed for anxiety. 30 tablet 1   azithromycin (ZITHROMAX) 250 MG tablet Sig as indicated (Patient not taking: Reported on 06/15/2021) 6 tablet 0   No facility-administered medications prior to visit.    ROS: Review of Systems  Constitutional:  Negative for appetite change, fatigue and unexpected weight change.  HENT:  Negative for congestion, nosebleeds, sneezing, sore throat and trouble swallowing.   Eyes:  Negative for itching and visual disturbance.  Respiratory:  Negative for cough.   Cardiovascular:  Negative for chest pain, palpitations and leg swelling.  Gastrointestinal:  Positive for abdominal distention, abdominal pain and nausea. Negative for blood in stool, constipation, diarrhea and vomiting.  Genitourinary:  Negative for frequency and hematuria.  Musculoskeletal:  Negative for back pain, gait problem, joint swelling and neck pain.  Skin:  Negative for color change and rash.  Neurological:  Negative for dizziness, tremors, speech difficulty and weakness.  Psychiatric/Behavioral:  Negative for agitation, dysphoric mood and sleep disturbance. The patient is not nervous/anxious.      Objective:  BP 130/80 (BP Location: Left Arm, Patient Position: Sitting, Cuff Size: Large)   Pulse 92   Temp 98.8 F (37.1 C) (Oral)   Ht 5\' 6"  (1.676 m)   Wt 168 lb (76.2 kg)   SpO2 99%   BMI 27.12 kg/m   BP Readings from Last 3 Encounters:  07/12/22 130/80  06/15/21 122/76  08/22/20 125/77    Wt Readings from Last 3 Encounters:  07/12/22 168 lb (76.2 kg)  06/15/21 164 lb (74.4 kg)  12/05/20 165 lb (74.8 kg)    Physical Exam Constitutional:      General: He is not in acute distress.    Appearance: Normal appearance. He is well-developed. He is not ill-appearing or toxic-appearing.     Comments: NAD  Eyes:     Conjunctiva/sclera: Conjunctivae normal.     Pupils: Pupils are equal, round, and reactive to light.  Neck:     Thyroid: No thyromegaly.     Vascular: No JVD.  Cardiovascular:     Rate and Rhythm: Normal rate and regular rhythm.     Heart sounds: Normal heart sounds. No murmur heard.    No friction rub. No gallop.  Pulmonary:     Effort: Pulmonary effort is normal. No respiratory distress.     Breath sounds: Normal breath sounds. No wheezing or rales.  Chest:     Chest wall: No tenderness.  Abdominal:     General: Bowel sounds are normal. There is distension.     Palpations: Abdomen is soft. There is no mass.     Tenderness: There  is abdominal tenderness. There is no guarding or rebound.  Musculoskeletal:        General: No tenderness. Normal range of motion.     Cervical back: Normal range of motion.  Lymphadenopathy:     Cervical: No cervical adenopathy.  Skin:    General: Skin is warm and dry.     Findings: No rash.  Neurological:     Mental Status: He is alert and oriented to person, place, and time.     Cranial Nerves: No cranial nerve deficit.     Motor: No abnormal muscle tone.     Coordination: Coordination normal.     Gait: Gait normal.     Deep Tendon Reflexes: Reflexes are normal and symmetric.  Psychiatric:        Behavior:  Behavior normal.        Thought Content: Thought content normal.        Judgment: Judgment normal.    RLQ with +/- rebound; tender R>>L     A total time of 45 minutes was spent preparing to see the patient, reviewing tests, x-rays, operative reports and other medical records.  Also, obtaining history and performing comprehensive physical exam.  Additionally, counseling the patient regarding the above listed issues - abd pain and arranging for STAT tests.   Finally, documenting clinical information in the health records, coordination of care, educating the patient.   Lab Results  Component Value Date   WBC 4.7 06/15/2021   HGB 15.1 06/15/2021   HCT 45.9 06/15/2021   PLT 195.0 06/15/2021   GLUCOSE 106 (H) 06/15/2021   CHOL 172 06/15/2021   TRIG 58.0 06/15/2021   HDL 48.10 06/15/2021   LDLCALC 112 (H) 06/15/2021   ALT 17 06/15/2021   AST 20 06/15/2021   NA 139 06/15/2021   K 4.0 06/15/2021   CL 103 06/15/2021   CREATININE 0.80 06/15/2021   BUN 12 06/15/2021   CO2 28 06/15/2021   TSH 1.190 06/20/2020   HGBA1C 6.2 06/15/2021    No results found.  Assessment & Plan:   Problem List Items Addressed This Visit     Nausea    Zofran IM given Zofran PO      RLQ abdominal pain    Abd pain x 2 days; r/o appendicitis Labs CT today Visit to ER now was offered - pt declined      Relevant Orders   CBC with Differential/Platelet   Urinalysis   Comprehensive metabolic panel   CT Abdomen Pelvis W Contrast      Meds ordered this encounter  Medications   ondansetron (ZOFRAN-ODT) 4 MG disintegrating tablet    Sig: Take 1 tablet (4 mg total) by mouth every 8 (eight) hours as needed for nausea or vomiting.    Dispense:  12 tablet    Refill:  0      Follow-up: Return for a follow-up visit.  Sonda Primes, MD

## 2022-07-12 NOTE — Addendum Note (Signed)
Addended by: Tresa Garter on: 07/12/2022 10:17 AM   Modules accepted: Level of Service

## 2022-07-12 NOTE — Assessment & Plan Note (Signed)
Abd pain x 2 days; r/o appendicitis Labs CT today Visit to ER now was offered - pt declined

## 2022-07-12 NOTE — Addendum Note (Signed)
Addended by: Delsa Grana R on: 07/12/2022 10:57 AM   Modules accepted: Orders

## 2022-07-13 ENCOUNTER — Other Ambulatory Visit: Payer: Self-pay

## 2022-07-13 ENCOUNTER — Encounter (HOSPITAL_COMMUNITY)
Admission: EM | Disposition: A | Discharge status: Home / Self Care | Payer: Self-pay | Source: Home / Self Care | Attending: Emergency Medicine

## 2022-07-13 ENCOUNTER — Ambulatory Visit
Admission: RE | Admit: 2022-07-13 | Discharge: 2022-07-13 | Disposition: A | Discharge status: Home / Self Care | Payer: BC Managed Care – PPO | Source: Ambulatory Visit | Attending: Internal Medicine | Admitting: Internal Medicine

## 2022-07-13 ENCOUNTER — Encounter (HOSPITAL_COMMUNITY): Payer: Self-pay

## 2022-07-13 ENCOUNTER — Observation Stay (HOSPITAL_COMMUNITY)
Admission: EM | Admit: 2022-07-13 | Discharge: 2022-07-14 | Disposition: A | Discharge status: Home / Self Care | Payer: BC Managed Care – PPO | Attending: Surgery | Admitting: Surgery

## 2022-07-13 ENCOUNTER — Observation Stay (HOSPITAL_COMMUNITY): Payer: BC Managed Care – PPO | Admitting: Anesthesiology

## 2022-07-13 ENCOUNTER — Telehealth: Payer: Self-pay

## 2022-07-13 DIAGNOSIS — K3580 Unspecified acute appendicitis: Secondary | ICD-10-CM | POA: Diagnosis not present

## 2022-07-13 DIAGNOSIS — N3289 Other specified disorders of bladder: Secondary | ICD-10-CM | POA: Diagnosis not present

## 2022-07-13 DIAGNOSIS — R1031 Right lower quadrant pain: Secondary | ICD-10-CM | POA: Diagnosis not present

## 2022-07-13 DIAGNOSIS — K353 Acute appendicitis with localized peritonitis, without perforation or gangrene: Secondary | ICD-10-CM | POA: Diagnosis not present

## 2022-07-13 DIAGNOSIS — K3189 Other diseases of stomach and duodenum: Secondary | ICD-10-CM | POA: Diagnosis not present

## 2022-07-13 HISTORY — PX: LAPAROSCOPIC APPENDECTOMY: SHX408

## 2022-07-13 LAB — COMPREHENSIVE METABOLIC PANEL
ALT: 17 U/L (ref 0–44)
AST: 19 U/L (ref 15–41)
Albumin: 3.9 g/dL (ref 3.5–5.0)
Alkaline Phosphatase: 67 U/L (ref 38–126)
Anion gap: 7 (ref 5–15)
BUN: 10 mg/dL (ref 6–20)
CO2: 29 mmol/L (ref 22–32)
Calcium: 9.2 mg/dL (ref 8.9–10.3)
Chloride: 103 mmol/L (ref 98–111)
Creatinine, Ser: 0.81 mg/dL (ref 0.61–1.24)
GFR, Estimated: >60 mL/min (ref >60)
Glucose, Bld: 140 mg/dL — ABNORMAL HIGH (ref 70–99)
Potassium: 3.9 mmol/L (ref 3.5–5.1)
Sodium: 139 mmol/L (ref 135–145)
Total Bilirubin: 0.6 mg/dL (ref 0.3–1.2)
Total Protein: 7 g/dL (ref 6.5–8.1)

## 2022-07-13 LAB — CBC
HCT: 42.9 % (ref 39.0–52.0)
Hemoglobin: 14.6 g/dL (ref 13.0–17.0)
MCH: 31.1 pg (ref 26.0–34.0)
MCHC: 34 g/dL (ref 30.0–36.0)
MCV: 91.5 fL (ref 80.0–100.0)
Platelets: 190 10*3/uL (ref 150–400)
RBC: 4.69 MIL/uL (ref 4.22–5.81)
RDW: 14.4 % (ref 11.5–15.5)
WBC: 6.5 10*3/uL (ref 4.0–10.5)
nRBC: 0 % (ref 0.0–0.2)

## 2022-07-13 SURGERY — APPENDECTOMY, LAPAROSCOPIC
Anesthesia: General

## 2022-07-13 MED ORDER — OXYCODONE HCL 5 MG PO TABS
ORAL_TABLET | ORAL | Status: AC
  Filled 2022-07-13: qty 1

## 2022-07-13 MED ORDER — FENTANYL CITRATE (PF) 250 MCG/5ML IJ SOLN
INTRAMUSCULAR | Status: AC
  Filled 2022-07-13: qty 5

## 2022-07-13 MED ORDER — FENTANYL CITRATE (PF) 250 MCG/5ML IJ SOLN
INTRAMUSCULAR | Status: DC | PRN
  Administered 2022-07-13 (×2): 100 ug via INTRAVENOUS

## 2022-07-13 MED ORDER — HYDRALAZINE HCL 20 MG/ML IJ SOLN: 10.0000 mg | INTRAMUSCULAR | Status: DC | PRN

## 2022-07-13 MED ORDER — ONDANSETRON HCL 4 MG/2ML IJ SOLN
INTRAMUSCULAR | Status: AC
  Filled 2022-07-13: qty 2

## 2022-07-13 MED ORDER — OXYCODONE HCL 5 MG/5ML PO SOLN: 5.0000 mg | Freq: Once | ORAL | Status: DC | PRN

## 2022-07-13 MED ORDER — MELATONIN 3 MG PO TABS: 3.0000 mg | ORAL_TABLET | Freq: Every evening | ORAL | Status: DC | PRN

## 2022-07-13 MED ORDER — BUPIVACAINE-EPINEPHRINE 0.25% -1:200000 IJ SOLN
INTRAMUSCULAR | Status: DC | PRN
  Administered 2022-07-13: 30 mL

## 2022-07-13 MED ORDER — SIMETHICONE 80 MG PO CHEW: 40.0000 mg | CHEWABLE_TABLET | Freq: Four times a day (QID) | ORAL | Status: DC | PRN

## 2022-07-13 MED ORDER — FENTANYL CITRATE (PF) 100 MCG/2ML IJ SOLN
25.0000 ug | INTRAMUSCULAR | Status: DC | PRN
  Administered 2022-07-13 (×2): 50 ug via INTRAVENOUS

## 2022-07-13 MED ORDER — SODIUM CHLORIDE 0.9 % IV SOLN
1.0000 g | Freq: Once | INTRAVENOUS | Status: AC
  Administered 2022-07-13: 1 g via INTRAVENOUS
  Filled 2022-07-13: qty 10

## 2022-07-13 MED ORDER — ACETAMINOPHEN 500 MG PO TABS
1000.0000 mg | ORAL_TABLET | Freq: Four times a day (QID) | ORAL | Status: DC
Start: 2022-07-13 — End: 2022-07-14
  Administered 2022-07-13 – 2022-07-14 (×2): 1000 mg via ORAL
  Filled 2022-07-13 (×3): qty 2

## 2022-07-13 MED ORDER — BUPIVACAINE-EPINEPHRINE (PF) 0.25% -1:200000 IJ SOLN
INTRAMUSCULAR | Status: AC
  Filled 2022-07-13: qty 30

## 2022-07-13 MED ORDER — ONDANSETRON HCL 4 MG/2ML IJ SOLN: 4.0000 mg | Freq: Four times a day (QID) | INTRAMUSCULAR | Status: DC | PRN

## 2022-07-13 MED ORDER — SUGAMMADEX SODIUM 500 MG/5ML IV SOLN
INTRAVENOUS | Status: AC
  Filled 2022-07-13: qty 10

## 2022-07-13 MED ORDER — PROPOFOL 10 MG/ML IV BOLUS
INTRAVENOUS | Status: AC
  Filled 2022-07-13: qty 20

## 2022-07-13 MED ORDER — METRONIDAZOLE 500 MG/100ML IV SOLN
500.0000 mg | Freq: Two times a day (BID) | INTRAVENOUS | Status: DC
  Filled 2022-07-13: qty 100

## 2022-07-13 MED ORDER — METHOCARBAMOL 1000 MG/10ML IJ SOLN: 500.0000 mg | Freq: Three times a day (TID) | INTRAVENOUS | Status: DC | PRN

## 2022-07-13 MED ORDER — SUGAMMADEX SODIUM 200 MG/2ML IV SOLN
INTRAVENOUS | Status: DC | PRN
  Administered 2022-07-13: 300 mg via INTRAVENOUS

## 2022-07-13 MED ORDER — ONDANSETRON HCL 4 MG/2ML IJ SOLN
4.0000 mg | Freq: Once | INTRAMUSCULAR | Status: AC
  Administered 2022-07-13: 4 mg via INTRAVENOUS
  Filled 2022-07-13: qty 2

## 2022-07-13 MED ORDER — FENTANYL CITRATE (PF) 100 MCG/2ML IJ SOLN
INTRAMUSCULAR | Status: AC
  Filled 2022-07-13: qty 2

## 2022-07-13 MED ORDER — SODIUM CHLORIDE 0.9 % IV SOLN
2.0000 g | INTRAVENOUS | Status: DC
  Filled 2022-07-13: qty 20

## 2022-07-13 MED ORDER — IOPAMIDOL (ISOVUE-300) INJECTION 61%
100.0000 mL | Freq: Once | INTRAVENOUS | Status: AC | PRN
  Administered 2022-07-13: 100 mL via INTRAVENOUS

## 2022-07-13 MED ORDER — MIDAZOLAM HCL 2 MG/2ML IJ SOLN
INTRAMUSCULAR | Status: AC
  Filled 2022-07-13: qty 2

## 2022-07-13 MED ORDER — ONDANSETRON HCL 4 MG/2ML IJ SOLN
4.0000 mg | Freq: Once | INTRAMUSCULAR | Status: AC | PRN
Start: 2022-07-13 — End: 2022-07-13
  Administered 2022-07-13: 4 mg via INTRAVENOUS

## 2022-07-13 MED ORDER — DIPHENHYDRAMINE HCL 50 MG/ML IJ SOLN: 25.0000 mg | Freq: Four times a day (QID) | INTRAMUSCULAR | Status: DC | PRN

## 2022-07-13 MED ORDER — MEPERIDINE HCL 25 MG/ML IJ SOLN
6.2500 mg | INTRAMUSCULAR | Status: DC | PRN
  Administered 2022-07-13 (×2): 12.5 mg via INTRAVENOUS

## 2022-07-13 MED ORDER — METRONIDAZOLE 500 MG/100ML IV SOLN
500.0000 mg | Freq: Once | INTRAVENOUS | Status: AC
  Administered 2022-07-13: 500 mg via INTRAVENOUS
  Filled 2022-07-13: qty 100

## 2022-07-13 MED ORDER — METHOCARBAMOL 500 MG PO TABS: 500.0000 mg | ORAL_TABLET | Freq: Three times a day (TID) | ORAL | Status: DC | PRN

## 2022-07-13 MED ORDER — ORAL CARE MOUTH RINSE: 15.0000 mL | Freq: Once | OROMUCOSAL | Status: AC

## 2022-07-13 MED ORDER — DEXAMETHASONE SODIUM PHOSPHATE 10 MG/ML IJ SOLN
INTRAMUSCULAR | Status: AC
  Filled 2022-07-13: qty 1

## 2022-07-13 MED ORDER — PROPOFOL 10 MG/ML IV BOLUS
INTRAVENOUS | Status: DC | PRN
  Administered 2022-07-13: 160 mg via INTRAVENOUS

## 2022-07-13 MED ORDER — OXYCODONE HCL 5 MG PO TABS: 5.0000 mg | ORAL_TABLET | Freq: Once | ORAL | Status: DC | PRN

## 2022-07-13 MED ORDER — MORPHINE SULFATE (PF) 4 MG/ML IV SOLN
4.0000 mg | Freq: Once | INTRAVENOUS | Status: AC
  Administered 2022-07-13: 4 mg via INTRAVENOUS
  Filled 2022-07-13: qty 1

## 2022-07-13 MED ORDER — MORPHINE SULFATE (PF) 2 MG/ML IV SOLN: 1.0000 mg | INTRAVENOUS | Status: DC | PRN

## 2022-07-13 MED ORDER — DEXAMETHASONE SODIUM PHOSPHATE 10 MG/ML IJ SOLN
INTRAMUSCULAR | Status: DC | PRN
  Administered 2022-07-13: 10 mg via INTRAVENOUS

## 2022-07-13 MED ORDER — ONDANSETRON HCL 4 MG/2ML IJ SOLN: 4.0000 mg | INTRAMUSCULAR | Status: DC | PRN

## 2022-07-13 MED ORDER — LACTATED RINGERS IV BOLUS
1000.0000 mL | Freq: Once | INTRAVENOUS | Status: AC
  Administered 2022-07-13: 1000 mL via INTRAVENOUS

## 2022-07-13 MED ORDER — MORPHINE SULFATE (PF) 2 MG/ML IV SOLN: 2.0000 mg | INTRAVENOUS | Status: DC | PRN

## 2022-07-13 MED ORDER — ROCURONIUM 10MG/ML (10ML) SYRINGE FOR MEDFUSION PUMP - OPTIME
INTRAVENOUS | Status: DC | PRN
  Administered 2022-07-13: 50 mg via INTRAVENOUS

## 2022-07-13 MED ORDER — ACETAMINOPHEN 500 MG PO TABS
ORAL_TABLET | ORAL | Status: AC
  Administered 2022-07-14: 1000 mg
  Filled 2022-07-13: qty 2

## 2022-07-13 MED ORDER — ACETAMINOPHEN 160 MG/5ML PO SOLN: 325.0000 mg | ORAL | Status: DC | PRN

## 2022-07-13 MED ORDER — IBUPROFEN 800 MG PO TABS: 800.0000 mg | ORAL_TABLET | Freq: Three times a day (TID) | ORAL | Status: DC | PRN

## 2022-07-13 MED ORDER — SODIUM CHLORIDE 0.9 % IR SOLN
Status: DC | PRN
  Administered 2022-07-13: 1000 mL

## 2022-07-13 MED ORDER — OXYCODONE HCL 5 MG PO TABS
5.0000 mg | ORAL_TABLET | ORAL | Status: DC | PRN
  Filled 2022-07-13: qty 2

## 2022-07-13 MED ORDER — LACTATED RINGERS IV SOLN: INTRAVENOUS | Status: DC

## 2022-07-13 MED ORDER — ONDANSETRON 4 MG PO TBDP: 4.0000 mg | ORAL_TABLET | Freq: Four times a day (QID) | ORAL | Status: DC | PRN

## 2022-07-13 MED ORDER — MEPERIDINE HCL 25 MG/ML IJ SOLN
INTRAMUSCULAR | Status: AC
  Filled 2022-07-13: qty 1

## 2022-07-13 MED ORDER — ENOXAPARIN SODIUM 40 MG/0.4ML IJ SOSY
40.0000 mg | PREFILLED_SYRINGE | INTRAMUSCULAR | Status: DC
  Administered 2022-07-14: 40 mg via SUBCUTANEOUS
  Filled 2022-07-13 (×2): qty 0.4

## 2022-07-13 MED ORDER — LIDOCAINE HCL (CARDIAC) PF 100 MG/5ML IV SOSY
PREFILLED_SYRINGE | INTRAVENOUS | Status: DC | PRN
  Administered 2022-07-13: 75 mg via INTRAVENOUS

## 2022-07-13 MED ORDER — MIDAZOLAM HCL 2 MG/2ML IJ SOLN
INTRAMUSCULAR | Status: DC | PRN
  Administered 2022-07-13: 2 mg via INTRAVENOUS

## 2022-07-13 MED ORDER — DIPHENHYDRAMINE HCL 25 MG PO CAPS: 25.0000 mg | ORAL_CAPSULE | Freq: Four times a day (QID) | ORAL | Status: DC | PRN

## 2022-07-13 MED ORDER — ACETAMINOPHEN 325 MG PO TABS: 325.0000 mg | ORAL_TABLET | ORAL | Status: DC | PRN

## 2022-07-13 MED ORDER — CHLORHEXIDINE GLUCONATE 0.12 % MT SOLN
OROMUCOSAL | Status: AC
  Administered 2022-07-13: 15 mL via OROMUCOSAL
  Filled 2022-07-13: qty 15

## 2022-07-13 MED ORDER — CHLORHEXIDINE GLUCONATE 0.12 % MT SOLN: 15.0000 mL | Freq: Once | OROMUCOSAL | Status: AC

## 2022-07-13 SURGICAL SUPPLY — 55 items
APPLIER CLIP 5 13 M/L LIGAMAX5 (MISCELLANEOUS)
BAG COUNTER SPONGE SURGICOUNT (BAG) ×1 IMPLANT
BLADE CLIPPER SURG (BLADE) IMPLANT
BLADE SURG 11 STRL SS (BLADE) IMPLANT
CANISTER SUCT 3000ML PPV (MISCELLANEOUS) ×1 IMPLANT
CHLORAPREP W/TINT 26 (MISCELLANEOUS) ×1 IMPLANT
CLIP APPLIE 5 13 M/L LIGAMAX5 (MISCELLANEOUS) IMPLANT
CLIP LIGATING HEMO LOK XL GOLD (MISCELLANEOUS) IMPLANT
COVER SURGICAL LIGHT HANDLE (MISCELLANEOUS) ×1 IMPLANT
CUTTER FLEX LINEAR 45M (STAPLE) ×1 IMPLANT
DERMABOND ADVANCED (GAUZE/BANDAGES/DRESSINGS) ×1
DERMABOND ADVANCED .7 DNX12 (GAUZE/BANDAGES/DRESSINGS) ×1 IMPLANT
ELECT REM PT RETURN 9FT ADLT (ELECTROSURGICAL) ×1
ELECTRODE REM PT RTRN 9FT ADLT (ELECTROSURGICAL) ×1 IMPLANT
GLOVE INDICATOR 6.5 STRL GRN (GLOVE) ×1 IMPLANT
GLOVE SURG ENC MOIS LTX SZ7.5 (GLOVE) ×1 IMPLANT
GLOVE SURG UNDER LTX SZ8 (GLOVE) ×1 IMPLANT
GOWN STRL REUS W/ TWL LRG LVL3 (GOWN DISPOSABLE) ×2 IMPLANT
GOWN STRL REUS W/ TWL XL LVL3 (GOWN DISPOSABLE) ×1 IMPLANT
GOWN STRL REUS W/TWL LRG LVL3 (GOWN DISPOSABLE) ×2
GOWN STRL REUS W/TWL XL LVL3 (GOWN DISPOSABLE) ×1
GRASPER SUT TROCAR 14GX15 (MISCELLANEOUS) ×1 IMPLANT
IRRIG SUCT STRYKERFLOW 2 WTIP (MISCELLANEOUS) ×1
IRRIGATION SUCT STRKRFLW 2 WTP (MISCELLANEOUS) IMPLANT
KIT BASIN OR (CUSTOM PROCEDURE TRAY) ×1 IMPLANT
KIT TURNOVER KIT B (KITS) ×1 IMPLANT
NDL HYPO 25GX1X1/2 BEV (NEEDLE) IMPLANT
NDL INSUFFLATION 14GA 120MM (NEEDLE) ×1 IMPLANT
NEEDLE 22X1 1/2 (OR ONLY) (NEEDLE) ×1 IMPLANT
NEEDLE HYPO 25GX1X1/2 BEV (NEEDLE) ×1 IMPLANT
NEEDLE INSUFFLATION 14GA 120MM (NEEDLE) ×1 IMPLANT
NS IRRIG 1000ML POUR BTL (IV SOLUTION) ×1 IMPLANT
PAD ARMBOARD 7.5X6 YLW CONV (MISCELLANEOUS) ×2 IMPLANT
POUCH RETRIEVAL ECOSAC 10 (ENDOMECHANICALS) IMPLANT
POUCH RETRIEVAL ECOSAC 10MM (ENDOMECHANICALS) ×1
POUCH SPECIMEN RETRIEVAL 10MM (ENDOMECHANICALS) ×1 IMPLANT
RELOAD 45 VASCULAR/THIN (ENDOMECHANICALS) IMPLANT
RELOAD STAPLE 45 2.5 WHT GRN (ENDOMECHANICALS) IMPLANT
RELOAD STAPLE 45 3.5 BLU ETS (ENDOMECHANICALS) IMPLANT
RELOAD STAPLE TA45 3.5 REG BLU (ENDOMECHANICALS) IMPLANT
SCISSORS LAP 5X35 DISP (ENDOMECHANICALS) IMPLANT
SET IRRIG TUBING LAPAROSCOPIC (IRRIGATION / IRRIGATOR) ×1 IMPLANT
SET TUBE SMOKE EVAC HIGH FLOW (TUBING) ×1 IMPLANT
SHEARS HARMONIC ACE PLUS 36CM (ENDOMECHANICALS) ×1 IMPLANT
SLEEVE ENDOPATH XCEL 5M (ENDOMECHANICALS) ×1 IMPLANT
SPECIMEN JAR SMALL (MISCELLANEOUS) ×1 IMPLANT
SUT MNCRL AB 4-0 PS2 18 (SUTURE) ×1 IMPLANT
TOWEL GREEN STERILE FF (TOWEL DISPOSABLE) ×1 IMPLANT
TRAY FOLEY W/BAG SLVR 16FR (SET/KITS/TRAYS/PACK) ×1
TRAY FOLEY W/BAG SLVR 16FR ST (SET/KITS/TRAYS/PACK) ×1 IMPLANT
TRAY LAPAROSCOPIC MC (CUSTOM PROCEDURE TRAY) ×1 IMPLANT
TROCAR XCEL 12X100 BLDLESS (ENDOMECHANICALS) ×1 IMPLANT
TROCAR XCEL NON-BLD 5MMX100MML (ENDOMECHANICALS) ×1 IMPLANT
WARMER LAPAROSCOPE (MISCELLANEOUS) IMPLANT
WATER STERILE IRR 1000ML POUR (IV SOLUTION) ×1 IMPLANT

## 2022-07-13 NOTE — ED Triage Notes (Signed)
Reports abd pain that started Wednesday night and sent by PCP for rule out appy.

## 2022-07-13 NOTE — Anesthesia Preprocedure Evaluation (Addendum)
Anesthesia Evaluation  Patient identified by MRN, date of birth, ID band Patient awake    Reviewed: Allergy & Precautions, H&P , NPO status , Patient's Chart, lab work & pertinent test results, reviewed documented beta blocker date and time   History of Anesthesia Complications Negative for: history of anesthetic complications  Airway Mallampati: II  TM Distance: >3 FB Neck ROM: Full    Dental  (+) Teeth Intact, Dental Advisory Given   Pulmonary neg pulmonary ROS, former smoker,    breath sounds clear to auscultation       Cardiovascular negative cardio ROS   Rhythm:Regular     Neuro/Psych negative neurological ROS  negative psych ROS   GI/Hepatic Neg liver ROS, Acute Appendicitis   Endo/Other  negative endocrine ROS  Renal/GU negative Renal ROS  negative genitourinary   Musculoskeletal negative musculoskeletal ROS (+)   Abdominal   Peds  Hematology negative hematology ROS (+) Lab Results      Component                Value               Date                      WBC                      6.5                 07/13/2022                HGB                      14.6                07/13/2022                HCT                      42.9                07/13/2022                MCV                      91.5                07/13/2022                PLT                      190                 07/13/2022              Anesthesia Other Findings   Reproductive/Obstetrics                           Anesthesia Physical Anesthesia Plan  ASA: 1  Anesthesia Plan: General   Post-op Pain Management: Ofirmev IV (intra-op)* and Toradol IV (intra-op)*   Induction: Intravenous  PONV Risk Score and Plan: 2 and Ondansetron, Dexamethasone and Treatment may vary due to age or medical condition  Airway Management Planned: Oral ETT  Additional Equipment: None  Intra-op Plan:   Post-operative Plan:  Extubation in OR  Informed Consent: I have reviewed the patients History and Physical,  chart, labs and discussed the procedure including the risks, benefits and alternatives for the proposed anesthesia with the patient or authorized representative who has indicated his/her understanding and acceptance.     Dental Advisory Given and Interpreter used for interveiw  Plan Discussed with: CRNA and Anesthesiologist  Anesthesia Plan Comments: (  )      Anesthesia Quick Evaluation

## 2022-07-13 NOTE — Telephone Encounter (Signed)
CRITICAL VALUE STICKER  CRITICAL VALUE: CT scan showed acute appendicitis   RECEIVER (on-site recipient of call): Dahlia Client  DATE & TIME NOTIFIED: 07/13/2022 8:26 am  MESSENGER (representative from lab): Jose  MD NOTIFIED: Dr. Okey Dupre  TIME OF NOTIFICATION: 07/13/2022 8:27 am  RESPONSE:  Inform pt to head the the emergency room asap. If he feels like he is not able to drive contact EMS for transport. Jose verbalized he will inform pt.    Sending to PCP just so he is aware.

## 2022-07-13 NOTE — Discharge Instructions (Signed)
CIRUGIA LAPAROSCOPICA: INSTRUCCIONES DE POST OPERATORIO.  Revise siempre los documentos que le entreguen en el lugar donde se ha hecho la cirugia.  SI USTED NECESITA DOCUMENTOS DE INCAPACIDAD (DISABLE) O DE PERMISO FAMILAR (FAMILY LEAVE) NECESITA TRAERLOS A LA OFICINA PARA QUE SEAN PROCESADOS. NO  SE LOS DE A SU DOCTOR. A su alta del hospital se le dara una receta para controlar el dolor. Tomela como ha sido recetada, si la necesita. Si no la necesita puede tomar, Acetaminofen (Tylenol) o Ibuprofen (Advil) para aliviar dolor moderado. Continue tomando el resto de sus medicinas. Si necesita rellenar la receta, llame a la farmacia. ellos contactan a nuestra oficina pidiendo autorizacion. Este tipo de receta no pueden ser rellenadas despues de las  5pm o durante los fines de semana. Con relacion a la dieta: debe ser ligera los primeros dias despues que llege a la casa. Ejemplo: sopas y galleticas. Tome bastante liquido esos dias. La mayoria de los pacientes padecen de inflamacion y cambio de coloracion de la piel alrededor de las incisiones. esto toma dias en resolver.  pnerse una bolsa de hielo en el area affectada ayuda..  Es comun tambien tener un poco de estrenimiento si esta tomado medicinas para el dolor. incremente la cantidad de liquidos a tomar y puede tomar (Colace) esto previene el problema. Si ya tiene estrenimiento, es decir no ha defecado en 48 horas, puede tomar un laxativo (Milk of Magnesia or Miralax) uselo como el paquete le explica.  A menos que se le diga algo diferente. Remueva el bendaje a las 24-48 horas despues dela cirugia. y puede banarse en la ducha sin ningun problema. usted puede tener steri-strips (pequenas curitas transparentes en la piel puesta encima de la incision)  Estas banditas strips should be left on the skin for 7-10 days.   Si su cirujano puso pegamento encima de la incision usted puede banarse bajo la ducha en 24 horas. Este pegamento empezara a caerse en las  proximas 2-3 semanas. Si le pusieron suturas o presillas (grapos) estos seran quitados en su proxima cita en la oficina. . ACTIVIDADES:  Puede hacer actividad ligera.  Como caminar , subir escaleras y poco a poco irlas incrementando tanto como las tolere. Puede tener relaciones sexuales cuando sea comfortable. No carge objetos pesados o haga esfuerzos que no sean aprovados por su doctor. Puede manejar en cuanto no esta tomando medicamentos fuertes (narcoticos) para el dolor, pueda abrochar confortablemente el cinturon de seguridad, y pueda maniobrar y usar los pedales de su vehiculo con seguridad. PUEDE REGRESAR A TRABAJAR  Debe ver a su doctor para una cita de seguimiento en 2-3 semanas despues de la cirugia.  OTRAS ISNSTRUCCIONES:___________________________________________________________________________________ CUANDO LLAMAR A SU MEDICO: FIEBRE mayor de  101.0 No produccion de orina. Sangramiento continue de la herida Incremento de dolor, enrojecimientio o drenaje de la herida (incision) Incremento de dolor abdominal.  The clinic staff is available to answer your questions during regular business hours.  Please don't hesitate to call and ask to speak to one of the nurses for clinical concerns.  If you have a medical emergency, go to the nearest emergency room or call 911.  A surgeon from [facility] is always on call at the hospital. [address], [address], [place], [place]  [address] ? [address] [address], [place], [place]   [address] [phone] ? [phone] ? FAX [phone] Web site: [email or web address]  

## 2022-07-13 NOTE — Anesthesia Postprocedure Evaluation (Signed)
Anesthesia Post Note  Patient: William Owens  Procedure(s) Performed: APPENDECTOMY LAPAROSCOPIC     Patient location during evaluation: PACU Anesthesia Type: General Level of consciousness: sedated and patient cooperative Pain management: pain level controlled Vital Signs Assessment: post-procedure vital signs reviewed and stable Respiratory status: spontaneous breathing Cardiovascular status: stable Anesthetic complications: no   No notable events documented.  Last Vitals:  Vitals:   07/13/22 2205 07/13/22 2210  BP:    Pulse: 74 (!) 105  Resp: 14 19  Temp:    SpO2: 95% 96%    Last Pain:  Vitals:   07/13/22 2200  TempSrc:   PainSc: 3                  Lewie Loron

## 2022-07-13 NOTE — Progress Notes (Signed)
Pacu RN Report to floor given  Gave report to  [employer]. Room: 6N20   Discussed surgery, meds given in OR and Pacu, VS, IV fluids given, EBL, urine output, pain and other pertinent information. Also discussed if pt had any family or friends here or belongings with them.   Discussed pt has 3 lapsites, CDI, no bleeding or hematoma noted. Pt tolerated PO fluids. Pain is 3/10, gave Demerol, Fentanyl and Oxycodone as documented.   Pt exits my care.

## 2022-07-13 NOTE — ED Notes (Signed)
Abx started prior to cultures.

## 2022-07-13 NOTE — Progress Notes (Signed)
Cheskel D Ades January 24, 1981  [id number].    Requesting MD: Dr. Doran Durand Chief Complaint/Reason for Consult: Acute Appendicitis  HPI: ZACHAREY JENSEN  is a 41 y.o. male who presented to the ED for abdominal pain after seeing his pcp today for RLQ abdominal that began on Wed that has gradually worsened. Some nausea, no vomiting. Diarrhea here. Denies fever, chills or any other symptoms. Denies hx of similar symptoms. No prior abdominal surgeries. Not on blood thinners. Workup here with wbc 6.5, c/w acute appendicitis without perforation or abscess on CT. We were asked to see.   ROS: ROS As above, see hpi  History reviewed. No pertinent family history.  Past Medical History:  Diagnosis Date   Allergy    Diverticulitis     History reviewed. No pertinent surgical history.  Social History:  reports that he has quit smoking. He has never used smokeless tobacco. He reports current alcohol use of about 1.0 - 2.0 standard drink of alcohol per week. He reports that he does not use drugs.  Allergies: No Known Allergies  (Not in a hospital admission)    Physical Exam: Blood pressure 121/75, pulse 65, temperature 98.7 F (37.1 C), temperature source Oral, resp. rate 17, height 5\' 6"  (1.676 m), weight 76.2 kg, SpO2 97 %. General: pleasant, WD/WN male who is laying in bed in NAD HEENT: head is normocephalic, atraumatic.  Sclera are noninjected.  PERRL.  Ears and nose without any masses or lesions.  Mouth is pink and moist. Dentition fair Heart: regular, rate, and rhythm.  Normal s1,s2. No obvious murmurs, gallops, or rubs noted.  Palpable pedal pulses bilaterally  Lungs: CTAB, no wheezes, rhonchi, or rales noted.  Respiratory effort nonlabored Abd:  Soft, ND, RLQ ttp, +BS, no masses, hernias, or organomegaly MS: no BUE/BLE edema, calves soft and nontender Skin: warm and dry with no masses, lesions, or rashes Psych: A&Ox4 with an appropriate affect Neuro: cranial nerves grossly  intact, equal strength in BUE/BLE bilaterally, normal speech, thought process intact, moves all extremities, gait not assessed   Results for orders placed or performed during the hospital encounter of 07/13/22 (from the past 48 hour(s))  CBC     Status: None   Collection Time: 07/13/22 10:26 AM  Result Value Ref Range   WBC 6.5 4.0 - 10.5 K/uL   RBC 4.69 4.22 - 5.81 MIL/uL   Hemoglobin 14.6 13.0 - 17.0 g/dL   HCT 07/15/22 30.1 - 60.1 %   MCV 91.5 80.0 - 100.0 fL   MCH 31.1 26.0 - 34.0 pg   MCHC 34.0 30.0 - 36.0 g/dL   RDW 09.3 23.5 - 57.3 %   Platelets 190 150 - 400 K/uL   nRBC 0.0 0.0 - 0.2 %    Comment: Performed at [facility], [address] [place], [place]  Comprehensive metabolic panel     Status: Abnormal   Collection Time: 07/13/22 10:26 AM  Result Value Ref Range   Sodium 139 135 - 145 mmol/L   Potassium 3.9 3.5 - 5.1 mmol/L   Chloride 103 98 - 111 mmol/L   CO2 29 22 - 32 mmol/L   Glucose, Bld 140 (H) 70 - 99 mg/dL    Comment: Glucose reference range applies only to samples taken after fasting for at least 8 hours.   BUN 10 6 - 20 mg/dL   Creatinine, Ser 07/15/22 0.61 - 1.24 mg/dL   Calcium 9.2 8.9 - 0.62 mg/dL  Total Protein 7.0 6.5 - 8.1 g/dL   Albumin 3.9 3.5 - 5.0 g/dL   AST 19 15 - 41 U/L   ALT 17 0 - 44 U/L   Alkaline Phosphatase 67 38 - 126 U/L   Total Bilirubin 0.6 0.3 - 1.2 mg/dL   GFR, Estimated >57 >84 mL/min    Comment: (NOTE) Calculated using the CKD-EPI Creatinine Equation (2021)    Anion gap 7 5 - 15    Comment: Performed at [facility], [address], [place], [place] [address]   CT Abdomen Pelvis W Contrast  Result Date: 07/13/2022 CLINICAL DATA:  Right lower quadrant pain for 3 days EXAM: CT ABDOMEN AND PELVIS WITH CONTRAST TECHNIQUE: Multidetector CT imaging of the abdomen and pelvis was performed using the standard protocol following bolus administration of intravenous contrast. RADIATION DOSE REDUCTION: This exam was  performed according to the departmental dose-optimization program which includes automated exposure control, adjustment of the mA and/or kV according to patient size and/or use of iterative reconstruction technique. CONTRAST:  ISOVUE-300 IOPAMIDOL (ISOVUE-300) INJECTION 61% COMPARISON:  04/09/2006 FINDINGS: Lower chest: No acute pleural or parenchymal lung disease. Hepatobiliary: No focal liver abnormality is seen. No gallstones, gallbladder wall thickening, or biliary dilatation. Pancreas: Unremarkable. No pancreatic ductal dilatation or surrounding inflammatory changes. Spleen: Normal in size without focal abnormality. Adrenals/Urinary Tract: The kidneys enhance normally and symmetrically. No urinary tract calculi or obstructive uropathy. The adrenals are unremarkable. The bladder is minimally distended, with high attenuation material seen throughout the bladder lumen. I do not see any evidence of early excretion of contrast, and underlying blood products or blood clot cannot be excluded. Please correlate with urinalysis, and if further evaluation is desired cystoscopy may be needed. Stomach/Bowel: There is a dilated inflamed appendix within the right lower quadrant measuring up to 10 mm in diameter. Increased mucosal enhancement, with mild periappendiceal fat stranding consistent with acute uncomplicated appendicitis. No perforation, fluid collection, or abscess. No bowel obstruction or ileus. Vascular/Lymphatic: No significant vascular findings are present. No enlarged abdominal or pelvic lymph nodes. Reproductive: Prostate is unremarkable. Other: No free fluid or free intraperitoneal gas. No abdominal wall hernia. Musculoskeletal: No acute or destructive bony lesions. Reconstructed images demonstrate no additional findings. IMPRESSION: 1. Acute uncomplicated appendicitis. No perforation, fluid collection, or abscess. 2. High attenuation material within the bladder lumen, concerning for blood products or  blood clot. Please correlate with urinalysis. Further evaluation with cystoscopy may be needed to exclude underlying uroepithelial lesion. Findings regarding the acute appendicitis were relayed by the technologist to Dr. Okey Dupre, and patient was instructed to report to the emergency room. Electronically Signed   By: Sharlet Salina M.D.   On: 07/13/2022 08:52    Anti-infectives (From admission, onward)    Start     Dose/Rate Route Frequency Ordered Stop   07/13/22 1130  cefTRIAXone (ROCEPHIN) 1 g in sodium chloride 0.9 % 100 mL IVPB        1 g 200 mL/hr over 30 Minutes Intravenous  Once 07/13/22 1121 07/13/22 1215   07/13/22 1130  metroNIDAZOLE (FLAGYL) IVPB 500 mg        500 mg 100 mL/hr over 60 Minutes Intravenous  Once 07/13/22 1121         Assessment/Plan Acute Appendicitis  Patient seen and examined. Vitals, labs, I/O, imaging, and notes reviewed. History, exam and imaging consistent with acute appendicitis. No evidence of perforation or abscess on CT. Discussed operative vs non-operative intervention.  I have explained  the procedure, risks, and aftercare of Laparoscopic Appendectomy.  Risks include but are not limited to anesthesia (MI, CVA, death), bleeding, infection, injury to surrounding structures (viscus, nerves, blood vessels, ureter), need for conversion to open procedure or ileocecectomy, post operative ileus or abscess, stump leak.  He seems to understand and agrees to proceed with surgery. Keep NPO. Start IV abx. Possible d/c from PACU. If requires admission post op, would plan for observation. Will write work note post op.  His wife was used as a Nurse, learning disability per their request.  FEN - NPO VTE - SCDs ID - Rocephin/Flagyl Foley - None Dispo - Plan for OR today.   I reviewed ED provider notes, last 24 h vitals and pain scores, last 48 h intake and output, last 24 h labs and trends, and last 24 h imaging results   Letha Cape, [facility] 07/13/2022,  12:17 PM Please see Amion for pager number during day hours 7:00am-4:30pm

## 2022-07-13 NOTE — Op Note (Signed)
Preoperative diagnosis: acute appendicitis  Postoperative diagnosis: Same   Procedure: laparoscopic appendectomy  Surgeon: Feliciana Rossetti, M.D.  Asst: none  Anesthesia: Gen.   Indications for procedure: William Owens is a 41 y.o. male with symptoms of pain in right lower quadrant and nausea consistent with acute appendicitis. Confirmed by CT.  Description of procedure: The patient was brought into the operative suite, placed supine. Anesthesia was administered with endotracheal tube. The patient's left arm was tucked. All pressure points were offloaded by foam padding. The patient was prepped and draped in the usual sterile fashion.  A transverse incision was made to the left of the umbilicus and a 32mm trocar was [place]. Pneumoperitoneum was applied with high flow low pressure.  2 47mm trocars were placed, one in the suprapubic space, one in the LLQ, the periumbilical incision was then up-sized and a 25mm trocar placed in that space. A transversus abdominal block was placed on the left and right sides. Next, the patient was placed in trendelenberg, rotated to the left. The omentum was retracted cephalad. The cecum and appendix were identified. The appendix was inflamed without signs of perforation. There was no surrounding purulence. The base of the appendix was dissected and a window through the mesoappendix was created with blunt dissection. Large Hem-o-lock clips were used to doubly ligate the base of the appendix and mesoappendix. The appendix was cut free with scissors.  The appendix was placed in a specimen bag. The pelvis and RLQ were irrigated. No fluid was seen in the pelvis. The appendix was removed via the 12 mm trocar. 0 vicryl was used to close the fascial defect. Pneumoperitoneum was removed, all trocars were removed. All incisions were closed with 4-0 monocryl subcuticular stitch. The patient woke from anesthesia and was brought to PACU in stable condition.  Findings: acute  appendicitis  Specimen: appendix  Blood loss: 20 ml  Local anesthesia: 30 ml Marcaine with Epinephrine  Complications: none  Feliciana Rossetti, M.D. General, Bariatric, & Minimally Invasive Surgery [facility]

## 2022-07-13 NOTE — ED Provider Triage Note (Addendum)
Emergency Medicine Provider Triage Evaluation Note  William Owens , a 41 y.o. male  was evaluated in triage.  Pt complains of abdominal pain x 4 days. Went to PCP yesterday with similar abd pain and they recommended he go to ER for eval to rule out appendicitis. He declined at that time. Pain is worse with bending over and laying down. Hasn't noticed pain worsening since yesterday, but is persistent.   PCP ordered CT scan this morning and called him to tell him his scan showed inflammation of his appendix and he should go to the ER to review his scan and determine treatment. Had one episode of diarrhea after CT scan today.  Review of Systems  Positive: Abdominal pain, diarrhea, chills Negative: Fever, nausea, vomiting  Physical Exam  BP 121/75   Pulse 65   Temp 98.7 F (37.1 C) (Oral)   Resp 17   Ht 5\' 6"  (1.676 m)   Wt 76.2 kg   SpO2 97%   BMI 27.12 kg/m  Gen:   Awake, no distress   Resp:  Normal effort  MSK:   Moves extremities without difficulty  Other:  RLQ tenderness to palpation with some guarding  Medical Decision Making  Medically screening exam initiated at 10:16 AM.  Appropriate orders placed.  William Owens was informed that the remainder of the evaluation will be completed by another provider, this initial triage assessment does not replace that evaluation, and the importance of remaining in the ED until their evaluation is complete.  CT scan results from 8am this morning: IMPRESSION: 1. Acute uncomplicated appendicitis. No perforation, fluid collection, or abscess. 2. High attenuation material within the bladder lumen, concerning for blood products or blood clot. Please correlate with urinalysis. Further evaluation with cystoscopy may be needed to exclude underlying uroepithelial lesion.    [name], PA-C 07/13/22 1023

## 2022-07-13 NOTE — ED Notes (Signed)
Consent with pt on the side table

## 2022-07-13 NOTE — Telephone Encounter (Signed)
Great, as advised EMS for transport to ER.

## 2022-07-13 NOTE — Progress Notes (Signed)
Pre-procedure checklist completed with the help of [employer] spanish interpreter.

## 2022-07-13 NOTE — ED Provider Notes (Signed)
[facility] EMERGENCY DEPARTMENT Provider Note   CSN: [id number] Arrival date & time: 07/13/22  8338     History Chief Complaint  Patient presents with   Abdominal Pain    HPI William Owens is a 41 y.o. male presenting for chief complaint of right lower quadrant abdominal pain.  Patient has a minimal medical history.  He has had pain since Wednesday.  Spine interpreter used for the entirety of this history. Patient otherwise ambulatory tolerating p.o. intake prior to this current episode.  No known sick contacts.  Outpatient CT earlier today with concern for appendicitis.  Sent by PCP for surgical evaluation.  Patient endorses ongoing fevers and nausea vomiting.   Patient's recorded medical, surgical, social, medication list and allergies were reviewed in the Snapshot window as part of the initial history.   Review of Systems   Review of Systems  Constitutional:  Negative for chills and fever.  HENT:  Negative for ear pain and sore throat.   Eyes:  Negative for pain and visual disturbance.  Respiratory:  Negative for cough and shortness of breath.   Cardiovascular:  Negative for chest pain and palpitations.  Gastrointestinal:  Positive for abdominal pain and nausea. Negative for anal bleeding and vomiting.  Genitourinary:  Negative for dysuria and hematuria.  Musculoskeletal:  Negative for arthralgias and back pain.  Skin:  Negative for color change and rash.  Neurological:  Negative for seizures and syncope.  All other systems reviewed and are negative.   Physical Exam Updated Vital Signs BP 121/75   Pulse 65   Temp 98.7 F (37.1 C) (Oral)   Resp 17   Ht 5\' 6"  (1.676 m)   Wt 76.2 kg   SpO2 97%   BMI 27.12 kg/m  Physical Exam Vitals and nursing note reviewed.  Constitutional:      General: He is not in acute distress.    Appearance: He is well-developed.  HENT:     Head: Normocephalic and atraumatic.  Eyes:     Conjunctiva/sclera:  Conjunctivae normal.  Cardiovascular:     Rate and Rhythm: Normal rate and regular rhythm.     Heart sounds: No murmur heard. Pulmonary:     Effort: Pulmonary effort is normal. No respiratory distress.     Breath sounds: Normal breath sounds.  Abdominal:     Palpations: Abdomen is soft.     Tenderness: There is abdominal tenderness in the right lower quadrant. There is guarding.  Musculoskeletal:        General: No swelling.     Cervical back: Neck supple.  Skin:    General: Skin is warm and dry.     Capillary Refill: Capillary refill takes less than 2 seconds.  Neurological:     Mental Status: He is alert.  Psychiatric:        Mood and Affect: Mood normal.      ED Course/ Medical Decision Making/ A&P    Procedures Procedures   Medications Ordered in ED Medications  lactated ringers bolus 1,000 mL (has no administration in time range)  cefTRIAXone (ROCEPHIN) 1 g in sodium chloride 0.9 % 100 mL IVPB (0 g Intravenous Stopped 07/13/22 1215)  metroNIDAZOLE (FLAGYL) IVPB 500 mg (has no administration in time range)  morphine (PF) 4 MG/ML injection 4 mg (4 mg Intravenous Given 07/13/22 1143)  ondansetron (ZOFRAN) injection 4 mg (4 mg Intravenous Given 07/13/22 1142)    Medical Decision Making:    William Owens is a  41 y.o. male who presented to the ED today with right lower quadrant pain detailed above.     Additional history discussed with patient's family/caregivers.  External chart has been reviewed including outside hospital CT films notable for right lower quadrant appendiceal swelling and localizing symptoms concerning for appendicitis with elevated white blood cell count in outpatient setting. Patient placed on continuous vitals and telemetry monitoring while in ED which was reviewed periodically.   Complete initial physical exam performed, notably the patient  was unable to stable with localizing right lower quadrant tenderness.      Reviewed and confirmed nursing  documentation for past medical history, family history, social history.    Initial Assessment:   Reviewed patient's outside read as below.  Concerning for appendicitis.  He has localizing signs and symptoms.  Discussed with general surgery immediately on patient arrival and started patient on IV antibiotics for stabilization.  Pain control with IV opiates and nausea control with IV antiemetics initiated.  Patient overall stable with no further acute events prior to admission with surgery team. Patient arranged for admission, consult with general surgery with no further acute events. Radiology  All images reviewed independently. Agree with radiology report at this time.   CT Abdomen Pelvis W Contrast  Result Date: 07/13/2022 CLINICAL DATA:  Right lower quadrant pain for 3 days EXAM: CT ABDOMEN AND PELVIS WITH CONTRAST TECHNIQUE: Multidetector CT imaging of the abdomen and pelvis was performed using the standard protocol following bolus administration of intravenous contrast. RADIATION DOSE REDUCTION: This exam was performed according to the departmental dose-optimization program which includes automated exposure control, adjustment of the mA and/or kV according to patient size and/or use of iterative reconstruction technique. CONTRAST:  ISOVUE-300 IOPAMIDOL (ISOVUE-300) INJECTION 61% COMPARISON:  04/09/2006 FINDINGS: Lower chest: No acute pleural or parenchymal lung disease. Hepatobiliary: No focal liver abnormality is seen. No gallstones, gallbladder wall thickening, or biliary dilatation. Pancreas: Unremarkable. No pancreatic ductal dilatation or surrounding inflammatory changes. Spleen: Normal in size without focal abnormality. Adrenals/Urinary Tract: The kidneys enhance normally and symmetrically. No urinary tract calculi or obstructive uropathy. The adrenals are unremarkable. The bladder is minimally distended, with high attenuation material seen throughout the bladder lumen. I do not see any  evidence of early excretion of contrast, and underlying blood products or blood clot cannot be excluded. Please correlate with urinalysis, and if further evaluation is desired cystoscopy may be needed. Stomach/Bowel: There is a dilated inflamed appendix within the right lower quadrant measuring up to 10 mm in diameter. Increased mucosal enhancement, with mild periappendiceal fat stranding consistent with acute uncomplicated appendicitis. No perforation, fluid collection, or abscess. No bowel obstruction or ileus. Vascular/Lymphatic: No significant vascular findings are present. No enlarged abdominal or pelvic lymph nodes. Reproductive: Prostate is unremarkable. Other: No free fluid or free intraperitoneal gas. No abdominal wall hernia. Musculoskeletal: No acute or destructive bony lesions. Reconstructed images demonstrate no additional findings. IMPRESSION: 1. Acute uncomplicated appendicitis. No perforation, fluid collection, or abscess. 2. High attenuation material within the bladder lumen, concerning for blood products or blood clot. Please correlate with urinalysis. Further evaluation with cystoscopy may be needed to exclude underlying uroepithelial lesion. Findings regarding the acute appendicitis were relayed by the technologist to Dr. Okey Dupre, and patient was instructed to report to the emergency room. Electronically Signed   By: Sharlet Salina M.D.   On: 07/13/2022 08:52    Disposition:   Based on the above findings, I believe this patient is stable for admission.  Patient/family educated about specific findings on our evaluation and explained exact reasons for admission.  Patient/family educated about clinical situation and time was allowed to answer questions.   Admission team communicated with and agreed with need for admission. Patient admitted. Patient  ready to move at this time.     Emergency Department Medication Summary:   Medications  lactated ringers bolus 1,000 mL (has no  administration in time range)  cefTRIAXone (ROCEPHIN) 1 g in sodium chloride 0.9 % 100 mL IVPB (0 g Intravenous Stopped 07/13/22 1215)  metroNIDAZOLE (FLAGYL) IVPB 500 mg (has no administration in time range)  morphine (PF) 4 MG/ML injection 4 mg (4 mg Intravenous Given 07/13/22 1143)  ondansetron (ZOFRAN) injection 4 mg (4 mg Intravenous Given 07/13/22 1142)        Clinical Impression:  1. Acute appendicitis with localized peritonitis, unspecified whether abscess present, unspecified whether gangrene present, unspecified whether perforation present      Admit   Final Clinical Impression(s) / ED Diagnoses Final diagnoses:  Acute appendicitis with localized peritonitis, unspecified whether abscess present, unspecified whether gangrene present, unspecified whether perforation present    Rx / DC Orders ED Discharge Orders     None         Glyn Ade, MD 07/13/22 1150

## 2022-07-13 NOTE — Anesthesia Procedure Notes (Signed)
Procedure Name: Intubation Date/Time: 07/13/2022 7:08 PM  Performed by: Molli Hazard, CRNAPre-anesthesia Checklist: Patient identified, Emergency Drugs available, Suction available and Patient being monitored Patient Re-evaluated:Patient Re-evaluated prior to induction Oxygen Delivery Method: Circle system utilized Preoxygenation: Pre-oxygenation with 100% oxygen Induction Type: IV induction Ventilation: Mask ventilation without difficulty Laryngoscope Size: Miller and 2 Grade View: Grade II Tube type: Oral Tube size: 7.5 mm Number of attempts: 1 Airway Equipment and Method: Stylet Placement Confirmation: ETT inserted through vocal cords under direct vision, positive ETCO2 and breath sounds checked- equal and bilateral Secured at: 23 cm Tube secured with: Tape Dental Injury: Teeth and Oropharynx as per pre-operative assessment

## 2022-07-13 NOTE — Transfer of Care (Signed)
Immediate Anesthesia Transfer of Care Note  Patient: William Owens  Procedure(s) Performed: APPENDECTOMY LAPAROSCOPIC  Patient Location: PACU  Anesthesia Type:General  Level of Consciousness: sedated  Airway & Oxygen Therapy: Patient Spontanous Breathing  Post-op Assessment: Report given to RN and Post -op Vital signs reviewed and stable  Post vital signs: Reviewed and stable  Last Vitals:  Vitals Value Taken Time  BP 120/62 07/13/22 2004  Temp    Pulse 88 07/13/22 2007  Resp 17 07/13/22 2007  SpO2 98 % 07/13/22 2007  Vitals shown include unvalidated device data.  Last Pain:  Vitals:   07/13/22 1555  TempSrc: Oral  PainSc: 0-No pain         Complications: No notable events documented.

## 2022-07-14 ENCOUNTER — Encounter (HOSPITAL_COMMUNITY): Payer: Self-pay | Admitting: General Surgery

## 2022-07-14 LAB — BASIC METABOLIC PANEL
Anion gap: 6 (ref 5–15)
BUN: 9 mg/dL (ref 6–20)
CO2: 27 mmol/L (ref 22–32)
Calcium: 9 mg/dL (ref 8.9–10.3)
Chloride: 105 mmol/L (ref 98–111)
Creatinine, Ser: 0.97 mg/dL (ref 0.61–1.24)
GFR, Estimated: >60 mL/min (ref >60)
Glucose, Bld: 206 mg/dL — ABNORMAL HIGH (ref 70–99)
Potassium: 4.2 mmol/L (ref 3.5–5.1)
Sodium: 138 mmol/L (ref 135–145)

## 2022-07-14 LAB — CBC
HCT: 41.4 % (ref 39.0–52.0)
Hemoglobin: 13.9 g/dL (ref 13.0–17.0)
MCH: 30.6 pg (ref 26.0–34.0)
MCHC: 33.6 g/dL (ref 30.0–36.0)
MCV: 91.2 fL (ref 80.0–100.0)
Platelets: 192 10*3/uL (ref 150–400)
RBC: 4.54 MIL/uL (ref 4.22–5.81)
RDW: 14.4 % (ref 11.5–15.5)
WBC: 9.8 10*3/uL (ref 4.0–10.5)
nRBC: 0 % (ref 0.0–0.2)

## 2022-07-14 LAB — HIV ANTIBODY (ROUTINE TESTING W REFLEX): HIV Screen 4th Generation wRfx: NONREACTIVE

## 2022-07-14 MED ORDER — OXYCODONE HCL 5 MG PO TABS: 5.0000 mg | ORAL_TABLET | Freq: Four times a day (QID) | ORAL | 0 refills | Status: DC | PRN

## 2022-07-14 NOTE — Discharge Summary (Signed)
    Physician Discharge Summary   Patient ID: BALIAN SCHALLER MRN: [id number] DOB/AGE: [birth year] 41 y.o.  Admit date: 07/13/2022  Discharge date: 07/14/2022  Discharge Diagnoses:  Principal Problem:   Acute appendicitis   Discharged Condition: good  Hospital Course: Patient was admitted for observation following laparoscopic appendectomy.  Post op course was uncomplicated.  Pain was well controlled.  Tolerated diet.  Patient was prepared for discharge home on POD#1.  Consults: None  Treatments: surgery: laparoscopic appendectomy - Dr. Sheliah Hatch  Discharge Exam: Blood pressure 121/78, pulse 74, temperature 98.2 F (36.8 C), temperature source Oral, resp. rate 17, height 5\' 6"  (1.676 m), weight 76.2 kg, SpO2 98 %. HEENT - clear Neck - soft Abd - soft without distension; mild tenderness  Disposition: Home  Discharge Instructions     Diet - low sodium heart healthy   Complete by: As directed    Increase activity slowly   Complete by: As directed    No dressing needed   Complete by: As directed       Allergies as of 07/14/2022   No Known Allergies      Medication List     TAKE these medications    cetirizine 10 MG tablet Commonly known as: ZYRTEC Take 1 tablet (10 mg total) by mouth daily. What changed:  when to take this reasons to take this   fluticasone 50 MCG/ACT nasal spray Commonly known as: FLONASE Use 2 spray(s) in each nostril once daily What changed: See the new instructions.   ondansetron 4 MG disintegrating tablet Commonly known as: ZOFRAN-ODT Take 1 tablet (4 mg total) by mouth every 8 (eight) hours as needed for nausea or vomiting.   oxyCODONE 5 MG immediate release tablet Commonly known as: Oxy IR/ROXICODONE Take 1 tablet (5 mg total) by mouth every 6 (six) hours as needed for moderate pain.               Discharge Care Instructions  (From admission, onward)           Start     Ordered   07/14/22 0000  No dressing  needed        07/14/22 0849            Follow-up Information     07/16/22, PA-C Follow up on 08/07/2022.   Specialty: General Surgery Why: @1030am . Please arrive 30 minutes prior to your appointment for paperwork. Please bring a copy of your photo ID and insurance card. Contact information: [address] [place] [address] [facility] [place] [phone]                 [id number], MD [facility] Office: [phone]   Signed: Darnell Level 07/14/2022, 8:49 AM

## 2022-07-14 NOTE — Plan of Care (Signed)

## 2022-07-14 NOTE — Progress Notes (Signed)
William Owens to be D/C'd  per MD order.  Discussed with the patient and all questions fully answered.  VSS, Skin clean, dry and intact without evidence of skin break down, no evidence of skin tears noted.  IV catheter discontinued intact. Site without signs and symptoms of complications. Dressing and pressure applied.  An After Visit Summary was printed and given to the patient. Patient received prescription.  D/c education completed with patient/family including follow up instructions, medication list, d/c activities limitations if indicated, with other d/c instructions as indicated by MD - patient able to verbalize understanding, all questions fully answered.   Patient instructed to return to ED, call 911, or call MD for any changes in condition.   Patient to be escorted via WC, and D/C home via private auto.

## 2022-07-17 LAB — SURGICAL PATHOLOGY

## 2022-07-23 NOTE — H&P (Signed)
   Reason for Consult:acute appendicitis  William Owens is an 41 y.o. male.  HPI: 41 yo male presented with appendicitis. He underwent laparoscopic appendectomy and was observed overnight.  Past Medical History:  Diagnosis Date   Allergy    Diverticulitis     Past Surgical History:  Procedure Laterality Date   LAPAROSCOPIC APPENDECTOMY N/A 07/13/2022   Procedure: APPENDECTOMY LAPAROSCOPIC;  Surgeon: Karelly Dewalt, De Blanch, MD;  Location: [facility] OR;  Service: General;  Laterality: N/A;    History reviewed. No pertinent family history.  Social History:  reports that he has quit smoking. He has never used smokeless tobacco. He reports current alcohol use of about 1.0 - 2.0 standard drink of alcohol per week. He reports that he does not use drugs.  Allergies: No Known Allergies  Medications: I have reviewed the patient's current medications.  No results found for this or any previous visit (from the past 48 hour(s)).  No results found.  Review of Systems  Constitutional: Negative.   HENT: Negative.    Eyes: Negative.   Respiratory: Negative.    Cardiovascular: Negative.   Gastrointestinal:  Positive for abdominal pain.  Genitourinary: Negative.   Musculoskeletal: Negative.   Skin: Negative.   Neurological: Negative.   Endo/Heme/Allergies: Negative.   Psychiatric/Behavioral: Negative.      PE Blood pressure 121/78, pulse 74, temperature 98.2 F (36.8 C), temperature source Oral, resp. rate 17, height 5\' 6"  (1.676 m), weight 76.2 kg, SpO2 98 %. Constitutional: NAD; conversant; no deformities Eyes: Moist conjunctiva; no lid lag; anicteric; PERRL Neck: Trachea midline; no thyromegaly Lungs: Normal respiratory effort; no tactile fremitus CV: RRR; no palpable thrills; no pitting edema GI: Abd right lower quadrant pain; no palpable hepatosplenomegaly MSK: Normal gait; no clubbing/cyanosis Psychiatric: Appropriate affect; alert and oriented x3 Lymphatic: No palpable cervical  or axillary lymphadenopathy Skin: No major subcutaneous nodules. Warm and dry   Assessment/Plan: 41 yo male with acute appendicitis -lap appendectomy -IV abx -pain control  I reviewed last 24 h vitals and pain scores, last 48 h intake and output, last 24 h labs and trends, and last 24 h imaging results.  This care required high  level of medical decision making.   46 William Owens 07/23/2022, 10:34 AM

## 2022-12-06 ENCOUNTER — Other Ambulatory Visit: Payer: Self-pay | Admitting: Emergency Medicine

## 2023-03-04 ENCOUNTER — Encounter: Payer: Self-pay | Admitting: Emergency Medicine

## 2023-03-04 ENCOUNTER — Ambulatory Visit (INDEPENDENT_AMBULATORY_CARE_PROVIDER_SITE_OTHER): Payer: BC Managed Care – PPO | Admitting: Emergency Medicine

## 2023-03-04 VITALS — BP 118/76 | HR 68 | Temp 97.9°F | Ht 66.0 in | Wt 167.0 lb

## 2023-03-04 DIAGNOSIS — Z13228 Encounter for screening for other metabolic disorders: Secondary | ICD-10-CM

## 2023-03-04 DIAGNOSIS — Z130 Encounter for screening for diseases of the blood and blood-forming organs and certain disorders involving the immune mechanism: Secondary | ICD-10-CM | POA: Diagnosis not present

## 2023-03-04 DIAGNOSIS — Z0000 Encounter for general adult medical examination without abnormal findings: Secondary | ICD-10-CM

## 2023-03-04 DIAGNOSIS — Z13220 Encounter for screening for lipoid disorders: Secondary | ICD-10-CM

## 2023-03-04 DIAGNOSIS — Z1329 Encounter for screening for other suspected endocrine disorder: Secondary | ICD-10-CM

## 2023-03-04 LAB — CBC WITH DIFFERENTIAL/PLATELET
Basophils Absolute: 0 10*3/uL (ref 0.0–0.1)
Basophils Relative: 0.6 % (ref 0.0–3.0)
Eosinophils Absolute: 0.1 10*3/uL (ref 0.0–0.7)
Eosinophils Relative: 1.4 % (ref 0.0–5.0)
HCT: 45.2 % (ref 39.0–52.0)
Hemoglobin: 15.3 g/dL (ref 13.0–17.0)
Lymphocytes Relative: 41.1 % (ref 12.0–46.0)
Lymphs Abs: 2 10*3/uL (ref 0.7–4.0)
MCHC: 33.8 g/dL (ref 30.0–36.0)
MCV: 91.8 fl (ref 78.0–100.0)
Monocytes Absolute: 0.5 10*3/uL (ref 0.1–1.0)
Monocytes Relative: 10.1 % (ref 3.0–12.0)
Neutro Abs: 2.3 10*3/uL (ref 1.4–7.7)
Neutrophils Relative %: 46.8 % (ref 43.0–77.0)
Platelets: 213 10*3/uL (ref 150.0–400.0)
RBC: 4.92 Mil/uL (ref 4.22–5.81)
RDW: 14.3 % (ref 11.5–15.5)
WBC: 4.9 10*3/uL (ref 4.0–10.5)

## 2023-03-04 LAB — LIPID PANEL
Cholesterol: 172 mg/dL (ref 0–200)
HDL: 53.9 mg/dL (ref >39.00)
LDL Cholesterol: 108 mg/dL — ABNORMAL HIGH (ref 0–99)
NonHDL: 117.88
Total CHOL/HDL Ratio: 3
Triglycerides: 50 mg/dL (ref 0.0–149.0)
VLDL: 10 mg/dL (ref 0.0–40.0)

## 2023-03-04 LAB — COMPREHENSIVE METABOLIC PANEL
ALT: 16 U/L (ref 0–53)
AST: 19 U/L (ref 0–37)
Albumin: 4.6 g/dL (ref 3.5–5.2)
Alkaline Phosphatase: 89 U/L (ref 39–117)
BUN: 13 mg/dL (ref 6–23)
CO2: 28 mEq/L (ref 19–32)
Calcium: 9.7 mg/dL (ref 8.4–10.5)
Chloride: 104 mEq/L (ref 96–112)
Creatinine, Ser: 0.75 mg/dL (ref 0.40–1.50)
GFR: 111.39 mL/min (ref >60.00)
Glucose, Bld: 104 mg/dL — ABNORMAL HIGH (ref 70–99)
Potassium: 4.5 mEq/L (ref 3.5–5.1)
Sodium: 139 mEq/L (ref 135–145)
Total Bilirubin: 0.4 mg/dL (ref 0.2–1.2)
Total Protein: 7.6 g/dL (ref 6.0–8.3)

## 2023-03-04 LAB — HEMOGLOBIN A1C: Hgb A1c MFr Bld: 6.1 % (ref 4.6–6.5)

## 2023-03-04 LAB — VITAMIN D 25 HYDROXY (VIT D DEFICIENCY, FRACTURES): VITD: 22.78 ng/mL — ABNORMAL LOW (ref 30.00–100.00)

## 2023-03-04 LAB — TSH: TSH: 1.11 u[IU]/mL (ref 0.35–5.50)

## 2023-03-04 LAB — VITAMIN B12: Vitamin B-12: 356 pg/mL (ref 211–911)

## 2023-03-04 MED ORDER — CETIRIZINE HCL 10 MG PO TABS: 10.0000 mg | ORAL_TABLET | Freq: Every day | ORAL | 5 refills | Status: DC

## 2023-03-04 MED ORDER — FLUTICASONE PROPIONATE 50 MCG/ACT NA SUSP
2.0000 | Freq: Every day | NASAL | 0 refills | Status: DC
Start: 2023-03-04 — End: 2024-03-02

## 2023-03-04 NOTE — Progress Notes (Signed)
William Owens 42 y.o.   Chief Complaint  Patient presents with   Annual Exam    HISTORY OF PRESENT ILLNESS: This is a 42 y.o. male here for annual exam. Overall doing well.  Physically active and eating better. Non-smoker.  No EtOH user.  Has no complaints or medical concerns today.  HPI   Prior to Admission medications   Medication Sig Start Date End Date Taking? Authorizing Provider  cetirizine (ZYRTEC) 10 MG tablet Take 10 mg by mouth daily.   Yes [provider]  cetirizine (ZYRTEC) 10 MG tablet Take 1 tablet (10 mg total) by mouth daily. 03/04/23   Georgina Quint, MD  fluticasone [facility]) 50 MCG/ACT nasal spray Place 2 sprays into both nostrils daily. Use 2 spray(s) in each nostril once daily Strength: 50 MCG/ACT 03/04/23   Georgina Quint, MD    No Known Allergies  There are no problems to display for this patient.   Past Medical History:  Diagnosis Date   Allergy    Diverticulitis     Past Surgical History:  Procedure Laterality Date   LAPAROSCOPIC APPENDECTOMY N/A 07/13/2022   Procedure: APPENDECTOMY LAPAROSCOPIC;  Surgeon: Kinsinger, De Blanch, MD;  Location: [facility] OR;  Service: General;  Laterality: N/A;    Social History   Socioeconomic History   Marital status: Married    Spouse name: Not on file   Number of children: Not on file   Years of education: Not on file   Highest education level: Not on file  Occupational History   Not on file  Tobacco Use   Smoking status: Former   Smokeless tobacco: Never  Substance and Sexual Activity   Alcohol use: Yes    Alcohol/week: 1.0 - 2.0 standard drink of alcohol    Types: 1 - 2 Standard drinks or equivalent per week    Comment: social   Drug use: No   Sexual activity: Not on file  Other Topics Concern   Not on file  Social History Narrative   Not on file   Social Determinants of Health   Financial Resource Strain: Not on file  Food Insecurity: Not on file  Transportation  Needs: Not on file  Physical Activity: Not on file  Stress: Not on file  Social Connections: Not on file  Intimate Partner Violence: Not on file    No family history on file.   Review of Systems  Constitutional: Negative.  Negative for chills and fever.  HENT: Negative.  Negative for congestion and sore throat.   Respiratory: Negative.  Negative for cough and shortness of breath.   Cardiovascular: Negative.  Negative for chest pain and palpitations.  Gastrointestinal: Negative.  Negative for abdominal pain, diarrhea, nausea and vomiting.  Skin: Negative.  Negative for rash.  Neurological: Negative.  Negative for dizziness and headaches.  All other systems reviewed and are negative.   Vitals:   03/04/23 0932  BP: 118/76  Pulse: 68  Temp: 97.9 F (36.6 C)  SpO2: 98%    Physical Exam Vitals reviewed.  Constitutional:      Appearance: Normal appearance.  HENT:     Head: Normocephalic.     Right Ear: Tympanic membrane, ear canal and external ear normal.     Left Ear: Tympanic membrane, ear canal and external ear normal.     Mouth/Throat:     Mouth: Mucous membranes are moist.     Pharynx: Oropharynx is clear.  Eyes:     Extraocular Movements: Extraocular  movements intact.     Conjunctiva/sclera: Conjunctivae normal.     Pupils: Pupils are equal, round, and reactive to light.  Cardiovascular:     Rate and Rhythm: Normal rate and regular rhythm.     Pulses: Normal pulses.     Heart sounds: Normal heart sounds.  Pulmonary:     Effort: Pulmonary effort is normal.     Breath sounds: Normal breath sounds.  Abdominal:     General: There is no distension.     Palpations: Abdomen is soft.     Tenderness: There is no abdominal tenderness.  Musculoskeletal:     Cervical back: No tenderness.     Right lower leg: No edema.     Left lower leg: No edema.  Lymphadenopathy:     Cervical: No cervical adenopathy.  Skin:    General: Skin is warm and dry.     Capillary Refill:  Capillary refill takes less than 2 seconds.  Neurological:     General: No focal deficit present.     Mental Status: He is alert and oriented to person, place, and time.  Psychiatric:        Mood and Affect: Mood normal.        Behavior: Behavior normal.      ASSESSMENT & PLAN: Problem List Items Addressed This Visit   None Visit Diagnoses     Routine general medical examination at a health care facility    -  Primary   Relevant Orders   CBC with Differential/Platelet   Comprehensive metabolic panel   Lipid panel   TSH   Hemoglobin A1c   Screening for deficiency anemia       Relevant Orders   CBC with Differential/Platelet   Screening for lipoid disorders       Relevant Orders   Lipid panel   Screening for endocrine, metabolic and immunity disorder       Relevant Orders   Comprehensive metabolic panel   TSH   Hemoglobin A1c   Vitamin B12   VITAMIN D 25 Hydroxy (Vit-D Deficiency, Fractures)     Modifiable risk factors discussed with patient. Anticipatory guidance according to age provided. The following topics were also discussed: Social Determinants of Health Smoking.  Non-smoker Diet and nutrition.  Good eating habits Benefits of exercise.  Physical work Cancer screening and family history review Vaccinations reviewed and recommendations Cardiovascular risk assessment and need for blood work Mental health including depression and anxiety Fall and accident prevention  Patient Instructions  Mantenimiento de Radiographer, therapeuticla salud en los Occidental Petroleumhombres Health Maintenance, Male Adoptar un estilo de vida saludable y recibir atencin preventiva son importantes para promover la salud y Counsellorel bienestar. Consulte al mdico sobre: El esquema adecuado para hacerse pruebas y exmenes peridicos. Cosas que puede hacer por su cuenta para prevenir enfermedades y Hallmantenerse sano. Qu debo saber sobre la dieta, el peso y el ejercicio? Consuma una dieta saludable  Consuma una dieta que incluya  muchas verduras, frutas, productos lcteos con bajo contenido de [place]grasa y Associate Professorprotenas magras. No consuma muchos alimentos ricos en grasas slidas, azcares agregados o sodio. Mantenga un peso saludable El ndice de masa muscular [facility](IMC) es una medida que puede utilizarse para identificar posibles problemas de [place]. Proporciona una estimacin de la grasa corporal basndose en el peso y la altura. Su mdico puede ayudarle a Engineer, sitedeterminar su IMC y a Personnel officerlograr o Pharmacologistmantener un peso saludable. Haga ejercicio con regularidad Haga ejercicio con regularidad. Esta es una de las prcticas ms  importantes que puede hacer por su salud. La [employer] de los adultos deben seguir estas pautas: Education officer, environmental, al menos, 150 minutos de actividad fsica por semana. El ejercicio debe aumentar la frecuencia cardaca y Media planner transpirar (ejercicio de intensidad moderada). Hacer ejercicios de fortalecimiento por lo [employer] por semana. Agregue esto a su plan de ejercicio de intensidad moderada. Pase menos tiempo sentado. Incluso la actividad fsica ligera puede ser beneficiosa. Controle sus niveles de colesterol y lpidos en la sangre Comience a realizarse anlisis de lpidos y Oncologist en la sangre a los 20 aos y luego reptalos cada 5 aos. Es posible que Insurance underwriter los niveles de colesterol con mayor frecuencia si: Sus niveles de lpidos y colesterol son altos. Es mayor de 40 aos. Presenta un alto riesgo de padecer enfermedades cardacas. Qu debo saber sobre las pruebas de deteccin del cncer? Muchos tipos de cncer pueden detectarse de manera temprana y, a menudo, pueden prevenirse. Segn su historia clnica y sus antecedentes familiares, es posible que deba realizarse pruebas de deteccin del cncer en diferentes edades. Esto puede incluir pruebas de deteccin de lo siguiente: Building services engineer. Cncer de prstata. Cncer de piel. Cncer de pulmn. Qu debo saber sobre la enfermedad cardaca, la diabetes y la  hipertensin arterial? Presin arterial y enfermedad cardaca La hipertensin arterial causa enfermedades cardacas y [place] el riesgo de accidente cerebrovascular. Es ms probable que esto se manifieste en las personas que tienen lecturas de presin arterial alta o tienen sobrepeso. Hable con el mdico sobre sus valores de presin arterial deseados. Hgase controlar la presin arterial: Cada 3 a 5 aos si tiene entre 18 y 57 aos. Todos los aos si es mayor de 40 aos. Si tiene entre 65 y 71 aos y es fumador o Insurance underwriter, pregntele al mdico si debe realizarse una prueba de deteccin de aneurisma artico abdominal (AAA) por nica vez. Diabetes Realcese exmenes de deteccin de la diabetes con regularidad. Este anlisis revisa el nivel de azcar en la sangre en [place]. Hgase las pruebas de deteccin: Cada tres aos despus de los 45 aos de edad si tiene un peso normal y un bajo riesgo de padecer diabetes. Con ms frecuencia y a partir de [place] edad inferior si tiene sobrepeso o un alto riesgo de padecer diabetes. Qu debo saber sobre la prevencin de infecciones? Hepatitis B Si tiene un riesgo ms alto de contraer hepatitis B, debe someterse a un examen de deteccin de este virus. Hable con el mdico para averiguar si tiene riesgo de contraer la infeccin por hepatitis B. Hepatitis C Se recomienda un anlisis de [place] para: Todos los que nacieron entre 1945 y [id number]. Todas las personas que tengan un riesgo de haber contrado hepatitis C. Enfermedades de transmisin sexual (ETS) Debe realizarse pruebas de deteccin de ITS todos los aos, incluidas la gonorrea y la clamidia, si: Es sexualmente activo y es menor de [address]. Es mayor de [address], y Public affairs consultant informa que corre riesgo de tener este tipo de infecciones. La actividad sexual ha cambiado desde que le hicieron la ltima prueba de deteccin y tiene un riesgo mayor de Warehouse manager clamidia o Copy. Pregntele al mdico si usted tiene  riesgo. Pregntele al mdico si usted tiene un alto riesgo de Primary school teacher VIH. El mdico tambin puede recomendarle un medicamento recetado para ayudar a evitar la infeccin por el VIH. Si elige tomar medicamentos para prevenir el VIH, primero debe [employer] de deteccin del VIH. Luego debe hacerse anlisis cada  3 meses mientras est tomando los medicamentos. Siga estas indicaciones en su casa: Consumo de alcohol No beba alcohol si el mdico se lo prohbe. Si bebe alcohol: Limite la cantidad que consume de 0 a 2 bebidas por da. Sepa cunta cantidad de alcohol hay en las bebidas que toma. En los [address], una medida equivale a una botella de cerveza de 12 oz (355 ml), un vaso de vino de 5 oz (148 ml) o un vaso de una bebida alcohlica de alta graduacin de 1 oz (44 ml). Estilo de vida No consuma ningn producto que contenga nicotina o tabaco. Estos productos incluyen cigarrillos, tabaco para Theatre manager y aparatos de vapeo, como los Administrator, Civil Service. Si necesita ayuda para dejar de consumir estos productos, consulte al mdico. No consuma drogas. No comparta agujas. Solicite ayuda a su mdico si necesita apoyo o informacin para abandonar las drogas. Indicaciones generales Realcese los estudios de rutina de [address], dentales y de Wellsite geologist. Mantngase al da con las vacunas. Infrmele a su mdico si: Se siente deprimido con frecuencia. Alguna vez ha sido vctima de [place] o no se siente seguro en su casa. Resumen Adoptar un estilo de vida saludable y recibir atencin preventiva son importantes para promover la salud y Counsellor. Siga las instrucciones del mdico acerca de una dieta saludable, el ejercicio y la realizacin de pruebas o exmenes para Hotel manager. Siga las instrucciones del mdico con respecto al control del colesterol y la presin arterial. Esta informacin no tiene Theme park manager el consejo del mdico. Asegrese de hacerle al mdico  cualquier pregunta que tenga. Document Revised: 04/19/2021 Document Reviewed: 04/19/2021 Elsevier Patient Education  2023 Elsevier Inc.     Edwina Barth, MD [facility]

## 2023-03-04 NOTE — Patient Instructions (Signed)
Mantenimiento de la salud en los hombres Health Maintenance, Male Adoptar un estilo de vida saludable y recibir atencin preventiva son importantes para promover la salud y el bienestar. Consulte al mdico sobre: El esquema adecuado para hacerse pruebas y exmenes peridicos. Cosas que puede hacer por su cuenta para prevenir enfermedades y mantenerse sano. Qu debo saber sobre la dieta, el peso y el ejercicio? Consuma una dieta saludable  Consuma una dieta que incluya muchas verduras, frutas, productos lcteos con bajo contenido de grasa y protenas magras. No consuma muchos alimentos ricos en grasas slidas, azcares agregados o sodio. Mantenga un peso saludable El ndice de masa muscular (IMC) es una medida que puede utilizarse para identificar posibles problemas de peso. Proporciona una estimacin de la grasa corporal basndose en el peso y la altura. Su mdico puede ayudarle a determinar su IMC y a lograr o mantener un peso saludable. Haga ejercicio con regularidad Haga ejercicio con regularidad. Esta es una de las prcticas ms importantes que puede hacer por su salud. La mayora de los adultos deben seguir estas pautas: Realizar, al menos, 150 minutos de actividad fsica por semana. El ejercicio debe aumentar la frecuencia cardaca y hacerlo transpirar (ejercicio de intensidad moderada). Hacer ejercicios de fortalecimiento por lo menos dos veces por semana. Agregue esto a su plan de ejercicio de intensidad moderada. Pase menos tiempo sentado. Incluso la actividad fsica ligera puede ser beneficiosa. Controle sus niveles de colesterol y lpidos en la sangre Comience a realizarse anlisis de lpidos y colesterol en la sangre a los 20 aos y luego reptalos cada 5 aos. Es posible que necesite controlar los niveles de colesterol con mayor frecuencia si: Sus niveles de lpidos y colesterol son altos. Es mayor de 40 aos. Presenta un alto riesgo de padecer enfermedades cardacas. Qu debo  saber sobre las pruebas de deteccin del cncer? Muchos tipos de cncer pueden detectarse de manera temprana y, a menudo, pueden prevenirse. Segn su historia clnica y sus antecedentes familiares, es posible que deba realizarse pruebas de deteccin del cncer en diferentes edades. Esto puede incluir pruebas de deteccin de lo siguiente: Cncer colorrectal. Cncer de prstata. Cncer de piel. Cncer de pulmn. Qu debo saber sobre la enfermedad cardaca, la diabetes y la hipertensin arterial? Presin arterial y enfermedad cardaca La hipertensin arterial causa enfermedades cardacas y aumenta el riesgo de accidente cerebrovascular. Es ms probable que esto se manifieste en las personas que tienen lecturas de presin arterial alta o tienen sobrepeso. Hable con el mdico sobre sus valores de presin arterial deseados. Hgase controlar la presin arterial: Cada 3 a 5 aos si tiene entre 18 y 39 aos. Todos los aos si es mayor de 40 aos. Si tiene entre 65 y 75 aos y es fumador o sola fumar, pregntele al mdico si debe realizarse una prueba de deteccin de aneurisma artico abdominal (AAA) por nica vez. Diabetes Realcese exmenes de deteccin de la diabetes con regularidad. Este anlisis revisa el nivel de azcar en la sangre en ayunas. Hgase las pruebas de deteccin: Cada tres aos despus de los 45 aos de edad si tiene un peso normal y un bajo riesgo de padecer diabetes. Con ms frecuencia y a partir de una edad inferior si tiene sobrepeso o un alto riesgo de padecer diabetes. Qu debo saber sobre la prevencin de infecciones? Hepatitis B Si tiene un riesgo ms alto de contraer hepatitis B, debe someterse a un examen de deteccin de este virus. Hable con el mdico para averiguar si tiene riesgo de   contraer la infeccin por hepatitis B. Hepatitis C Se recomienda un anlisis de sangre para: Todos los que nacieron entre 1945 y 1965. Todas las personas que tengan un riesgo de haber  contrado hepatitis C. Enfermedades de transmisin sexual (ETS) Debe realizarse pruebas de deteccin de ITS todos los aos, incluidas la gonorrea y la clamidia, si: Es sexualmente activo y es menor de 24 aos. Es mayor de 24 aos, y el mdico le informa que corre riesgo de tener este tipo de infecciones. La actividad sexual ha cambiado desde que le hicieron la ltima prueba de deteccin y tiene un riesgo mayor de tener clamidia o gonorrea. Pregntele al mdico si usted tiene riesgo. Pregntele al mdico si usted tiene un alto riesgo de contraer VIH. El mdico tambin puede recomendarle un medicamento recetado para ayudar a evitar la infeccin por el VIH. Si elige tomar medicamentos para prevenir el VIH, primero debe hacerse los anlisis de deteccin del VIH. Luego debe hacerse anlisis cada 3 meses mientras est tomando los medicamentos. Siga estas indicaciones en su casa: Consumo de alcohol No beba alcohol si el mdico se lo prohbe. Si bebe alcohol: Limite la cantidad que consume de 0 a 2 bebidas por da. Sepa cunta cantidad de alcohol hay en las bebidas que toma. En los [place], una medida equivale a una botella de cerveza de 12 oz (355 ml), un vaso de vino de 5 oz (148 ml) o un vaso de una bebida alcohlica de alta graduacin de 1 oz (44 ml). Estilo de vida No consuma ningn producto que contenga nicotina o tabaco. Estos productos incluyen cigarrillos, tabaco para mascar y aparatos de vapeo, como los cigarrillos electrnicos. Si necesita ayuda para dejar de consumir estos productos, consulte al mdico. No consuma drogas. No comparta agujas. Solicite ayuda a su mdico si necesita apoyo o informacin para abandonar las drogas. Indicaciones generales Realcese los estudios de rutina de la salud, dentales y de la vista. Mantngase al da con las vacunas. Infrmele a su mdico si: Se siente deprimido con frecuencia. Alguna vez ha sido vctima de maltrato o no se siente seguro en su  casa. Resumen Adoptar un estilo de vida saludable y recibir atencin preventiva son importantes para promover la salud y el bienestar. Siga las instrucciones del mdico acerca de una dieta saludable, el ejercicio y la realizacin de pruebas o exmenes para detectar enfermedades. Siga las instrucciones del mdico con respecto al control del colesterol y la presin arterial. Esta informacin no tiene como fin reemplazar el consejo del mdico. Asegrese de hacerle al mdico cualquier pregunta que tenga. Document Revised: 04/19/2021 Document Reviewed: 04/19/2021 Elsevier Patient Education  2023 Elsevier Inc.  

## 2023-03-05 ENCOUNTER — Telehealth: Payer: Self-pay | Admitting: Emergency Medicine

## 2023-03-05 NOTE — Telephone Encounter (Signed)
Patient's wife called to return call from yesterday and said patient is unable to answer the phone at work. She would like to know if Roe Coombs can call her about his lab results at [phone].

## 2023-03-05 NOTE — Telephone Encounter (Signed)
Called patient wife to go over lab results

## 2023-12-12 ENCOUNTER — Ambulatory Visit
Admission: EM | Admit: 2023-12-12 | Discharge: 2023-12-12 | Disposition: A | Discharge status: Home / Self Care | Payer: BC Managed Care – PPO | Attending: Family Medicine | Admitting: Family Medicine

## 2023-12-12 DIAGNOSIS — J0120 Acute ethmoidal sinusitis, unspecified: Secondary | ICD-10-CM | POA: Diagnosis not present

## 2023-12-12 LAB — POCT RAPID STREP A (OFFICE): Rapid Strep A Screen: NEGATIVE

## 2023-12-12 MED ORDER — AMOXICILLIN-POT CLAVULANATE 875-125 MG PO TABS: 1.0000 | ORAL_TABLET | Freq: Two times a day (BID) | ORAL | 0 refills | Status: DC

## 2023-12-12 NOTE — ED Provider Notes (Signed)
[facility]    CSN: [id number] Arrival date & time: 12/12/23  1754      History   Chief Complaint Chief Complaint  Patient presents with   Nasal Congestion   Sore Throat    HPI William Owens is a 43 y.o. male  presents for evaluation of URI symptoms for 2 weeks.  Patient is companied by wife.  He does speak some English as well as Spanish so wife does help to interpret as needed.  Patient reports associated symptoms of sore throat with sinus pressure/pain and purulent nasal discharge with a burning sensation in his nose. Denies N/V/D, cough, fevers, ear pain, body aches, shortness of breath. Patient does not have a hx of asthma. Patient is is not an active smoker.   Reports daughter and wife also have similar symptoms.  Pt has taken flu OTC for symptoms. Pt has no other concerns at this time.    Sore Throat    Past Medical History:  Diagnosis Date   Allergy    Diverticulitis     There are no active problems to display for this patient.   Past Surgical History:  Procedure Laterality Date   LAPAROSCOPIC APPENDECTOMY N/A 07/13/2022   Procedure: APPENDECTOMY LAPAROSCOPIC;  Surgeon: Kinsinger, De Blanch, MD;  Location: [facility] OR;  Service: General;  Laterality: N/A;       Home Medications    Prior to Admission medications   Medication Sig Start Date End Date Taking? Authorizing Provider  amoxicillin-clavulanate (AUGMENTIN) 875-125 MG tablet Take 1 tablet by mouth every 12 (twelve) hours. 12/12/23  Yes Radford Pax, NP  cetirizine (ZYRTEC) 10 MG tablet Take 1 tablet (10 mg total) by mouth daily. 03/04/23   Georgina Quint, MD  cetirizine (ZYRTEC) 10 MG tablet Take 10 mg by mouth daily.    [provider]  fluticasone (FLONASE) 50 MCG/ACT nasal spray Place 2 sprays into both nostrils daily. Use 2 spray(s) in each nostril once daily Strength: 50 MCG/ACT 03/04/23   Georgina Quint, MD    Family History History reviewed. No pertinent  family history.  Social History Social History   Tobacco Use   Smoking status: Former   Smokeless tobacco: Never  Substance Use Topics   Alcohol use: Yes    Alcohol/week: 1.0 - 2.0 standard drink of alcohol    Types: 1 - 2 Standard drinks or equivalent per week    Comment: social   Drug use: No     Allergies   Patient has no known allergies.   Review of Systems Review of Systems  HENT:  Positive for sinus pressure, sinus pain and sore throat.      Physical Exam Triage Vital Signs ED Triage Vitals [12/12/23 1802]  Encounter Vitals Group     BP 136/84     Systolic BP Percentile      Diastolic BP Percentile      Pulse Rate 67     Resp 18     Temp 98.1 F (36.7 C)     Temp Source Oral     SpO2 98 %     Weight      Height      Head Circumference      Peak Flow      Pain Score 2     Pain Loc      Pain Education      Exclude from Growth Chart    No data found.  Updated Vital Signs BP 136/84 (BP  Location: Right Arm)   Pulse 67   Temp 98.1 F (36.7 C) (Oral)   Resp 18   SpO2 98%   Visual Acuity Right Eye Distance:   Left Eye Distance:   Bilateral Distance:    Right Eye Near:   Left Eye Near:    Bilateral Near:     Physical Exam Vitals and nursing note reviewed.  Constitutional:      General: He is not in acute distress.    Appearance: Normal appearance. He is not ill-appearing or toxic-appearing.  HENT:     Head: Normocephalic and atraumatic.     Right Ear: Tympanic membrane and ear canal normal.     Left Ear: Tympanic membrane and ear canal normal.     Nose: Congestion present.     Right Turbinates: Swollen and pale.     Left Turbinates: Swollen and pale.     Comments: TTP to ethmoid sinuses     Mouth/Throat:     Mouth: Mucous membranes are moist.     Pharynx: Posterior oropharyngeal erythema present.  Eyes:     Pupils: Pupils are equal, round, and reactive to light.  Cardiovascular:     Rate and Rhythm: Normal rate and regular rhythm.      Heart sounds: Normal heart sounds.  Pulmonary:     Effort: Pulmonary effort is normal.     Breath sounds: Normal breath sounds.  Musculoskeletal:     Cervical back: Normal range of motion and neck supple.  Lymphadenopathy:     Cervical: No cervical adenopathy.  Skin:    General: Skin is warm and dry.  Neurological:     General: No focal deficit present.     Mental Status: He is alert and oriented to person, place, and time.  Psychiatric:        Mood and Affect: Mood normal.        Behavior: Behavior normal.      UC Treatments / Results  Labs (all labs ordered are listed, but only abnormal results are displayed) Labs Reviewed  POCT RAPID STREP A (OFFICE)    EKG   Radiology No results found.  Procedures Procedures (including critical care time)  Medications Ordered in UC Medications - No data to display  Initial Impression / Assessment and Plan / UC Course  I have reviewed the triage vital signs and the nursing notes.  Pertinent labs & imaging results that were available during my care of the patient were reviewed by me and considered in my medical decision making (see chart for details).     Reviewed exam and symptoms with patient and wife.  No red flags.  Will start Augmentin for sinusitis.  Discussed burning sensation likely secondary to the Flonase as he has been using a lot recently.  Advised to stop for a few days.  May do nasal rinses as tolerated.  PCP follow-up as symptoms do not improve.  ER precautions reviewed. Final Clinical Impressions(s) / UC Diagnoses   Final diagnoses:  Acute ethmoidal sinusitis, recurrence not specified     Discharge Instructions      Augmentin twice daily for 7 days.  Lots of rest and fluids.  Please follow-up with your PCP if your symptoms do not improve.  Please go to the ER for any worsening symptoms.  I hope you feel better soon!    ED Prescriptions     Medication Sig Dispense Auth. Provider    amoxicillin-clavulanate (AUGMENTIN) 875-125 MG tablet Take 1 tablet by  mouth every 12 (twelve) hours. 14 tablet Radford Pax, NP      PDMP not reviewed this encounter.   Radford Pax, NP 12/12/23 [phone]

## 2023-12-12 NOTE — ED Triage Notes (Signed)
Pt presents with c/o runny nose and states his nose feels it is burning X 5 days. Reports yellow drainage.   States he has a sore throat, reports his throat is red.

## 2023-12-12 NOTE — Discharge Instructions (Addendum)
Augmentin twice daily for 7 days.  Lots of rest and fluids.  Please follow-up with your PCP if your symptoms do not improve.  Please go to the ER for any worsening symptoms.  I hope you feel better soon!

## 2023-12-19 ENCOUNTER — Ambulatory Visit: Payer: BC Managed Care – PPO | Admitting: Emergency Medicine

## 2024-03-02 ENCOUNTER — Other Ambulatory Visit: Payer: Self-pay | Admitting: Emergency Medicine

## 2024-09-22 ENCOUNTER — Encounter: Payer: Self-pay | Admitting: Emergency Medicine

## 2024-09-22 ENCOUNTER — Ambulatory Visit: Payer: Self-pay | Admitting: Emergency Medicine

## 2024-09-22 ENCOUNTER — Ambulatory Visit (INDEPENDENT_AMBULATORY_CARE_PROVIDER_SITE_OTHER): Admitting: Emergency Medicine

## 2024-09-22 VITALS — BP 118/72 | HR 62 | Temp 98.0°F | Ht 66.0 in | Wt 175.6 lb

## 2024-09-22 DIAGNOSIS — Z13228 Encounter for screening for other metabolic disorders: Secondary | ICD-10-CM

## 2024-09-22 DIAGNOSIS — Z1329 Encounter for screening for other suspected endocrine disorder: Secondary | ICD-10-CM

## 2024-09-22 DIAGNOSIS — Z125 Encounter for screening for malignant neoplasm of prostate: Secondary | ICD-10-CM

## 2024-09-22 DIAGNOSIS — Z0000 Encounter for general adult medical examination without abnormal findings: Secondary | ICD-10-CM | POA: Diagnosis not present

## 2024-09-22 DIAGNOSIS — Z130 Encounter for screening for diseases of the blood and blood-forming organs and certain disorders involving the immune mechanism: Secondary | ICD-10-CM

## 2024-09-22 DIAGNOSIS — Z13220 Encounter for screening for lipoid disorders: Secondary | ICD-10-CM | POA: Diagnosis not present

## 2024-09-22 LAB — COMPREHENSIVE METABOLIC PANEL WITH GFR
ALT: 16 U/L (ref 0–53)
AST: 17 U/L (ref 0–37)
Albumin: 4.5 g/dL (ref 3.5–5.2)
Alkaline Phosphatase: 84 U/L (ref 39–117)
BUN: 16 mg/dL (ref 6–23)
CO2: 30 meq/L (ref 19–32)
Calcium: 9.2 mg/dL (ref 8.4–10.5)
Chloride: 104 meq/L (ref 96–112)
Creatinine, Ser: 0.77 mg/dL (ref 0.40–1.50)
GFR: 109.31 mL/min (ref >60.00)
Glucose, Bld: 104 mg/dL — ABNORMAL HIGH (ref 70–99)
Potassium: 3.9 meq/L (ref 3.5–5.1)
Sodium: 141 meq/L (ref 135–145)
Total Bilirubin: 0.4 mg/dL (ref 0.2–1.2)
Total Protein: 6.9 g/dL (ref 6.0–8.3)

## 2024-09-22 LAB — CBC WITH DIFFERENTIAL/PLATELET
Basophils Absolute: 0 K/uL (ref 0.0–0.1)
Basophils Relative: 0.6 % (ref 0.0–3.0)
Eosinophils Absolute: 0.1 K/uL (ref 0.0–0.7)
Eosinophils Relative: 1.8 % (ref 0.0–5.0)
HCT: 44.8 % (ref 39.0–52.0)
Hemoglobin: 14.9 g/dL (ref 13.0–17.0)
Lymphocytes Relative: 46.7 % — ABNORMAL HIGH (ref 12.0–46.0)
Lymphs Abs: 2.3 K/uL (ref 0.7–4.0)
MCHC: 33.3 g/dL (ref 30.0–36.0)
MCV: 91.7 fl (ref 78.0–100.0)
Monocytes Absolute: 0.5 K/uL (ref 0.1–1.0)
Monocytes Relative: 9.9 % (ref 3.0–12.0)
Neutro Abs: 2 K/uL (ref 1.4–7.7)
Neutrophils Relative %: 41 % — ABNORMAL LOW (ref 43.0–77.0)
Platelets: 207 K/uL (ref 150.0–400.0)
RBC: 4.89 Mil/uL (ref 4.22–5.81)
RDW: 13.9 % (ref 11.5–15.5)
WBC: 4.9 K/uL (ref 4.0–10.5)

## 2024-09-22 LAB — LIPID PANEL
Cholesterol: 179 mg/dL (ref 0–200)
HDL: 43.5 mg/dL (ref >39.00)
LDL Cholesterol: 121 mg/dL — ABNORMAL HIGH (ref 0–99)
NonHDL: 135.94
Total CHOL/HDL Ratio: 4
Triglycerides: 73 mg/dL (ref 0.0–149.0)
VLDL: 14.6 mg/dL (ref 0.0–40.0)

## 2024-09-22 LAB — PSA: PSA: 1.25 ng/mL (ref 0.10–4.00)

## 2024-09-22 LAB — HEMOGLOBIN A1C: Hgb A1c MFr Bld: 6.3 % (ref 4.6–6.5)

## 2024-09-22 NOTE — Progress Notes (Signed)
William Owens 43 y.o.   Chief Complaint  Patient presents with   Annual Exam    HISTORY OF PRESENT ILLNESS: This is a 43 y.o. male here for annual exam. Overall doing well.  Has no complaints or medical concerns today.  HPI   Prior to Admission medications   Medication Sig Start Date End Date Taking? Authorizing Provider  cetirizine  (ZYRTEC ) 10 MG tablet Take 10 mg by mouth daily.    [provider]  fluticasone  (FLONASE ) 50 MCG/ACT nasal spray Use 2 spray(s) in each nostril once daily 03/02/24   Nekisha Mcdiarmid Jose, MD    No Known Allergies  There are no active problems to display for this patient.   Past Medical History:  Diagnosis Date   Allergy    Diverticulitis     Past Surgical History:  Procedure Laterality Date   LAPAROSCOPIC APPENDECTOMY N/A 07/13/2022   Procedure: APPENDECTOMY LAPAROSCOPIC;  Surgeon: Kinsinger, Herlene Righter, MD;  Location: [facility] OR;  Service: General;  Laterality: N/A;    Social History   Socioeconomic History   Marital status: Married    Spouse name: Not on file   Number of children: Not on file   Years of education: Not on file   Highest education level: Not on file  Occupational History   Not on file  Tobacco Use   Smoking status: Former   Smokeless tobacco: Never  Substance and Sexual Activity   Alcohol use: Yes    Alcohol/week: 1.0 - 2.0 standard drink of alcohol    Types: 1 - 2 Standard drinks or equivalent per week    Comment: social   Drug use: No   Sexual activity: Not on file  Other Topics Concern   Not on file  Social History Narrative   Not on file   Social Drivers of Health   Financial Resource Strain: Not on file  Food Insecurity: Not on file  Transportation Needs: Not on file  Physical Activity: Not on file  Stress: Not on file  Social Connections: Not on file  Intimate Partner Violence: Not on file    No family history on file.   Review of Systems  Constitutional: Negative.   Negative for chills and fever.  HENT: Negative.  Negative for congestion and sore throat.   Respiratory: Negative.  Negative for cough and shortness of breath.   Cardiovascular: Negative.  Negative for chest pain and palpitations.  Gastrointestinal:  Negative for abdominal pain, diarrhea, nausea and vomiting.  Genitourinary: Negative.  Negative for dysuria and hematuria.  Skin: Negative.  Negative for rash.  Neurological: Negative.  Negative for dizziness and headaches.  All other systems reviewed and are negative.   Today's Vitals   09/22/24 0811  BP: 118/72  Pulse: 62  Temp: 98 F (36.7 C)  TempSrc: Oral  SpO2: 98%  Weight: 175 lb 9.6 oz (79.7 kg)  Height: 5' 6 (1.676 m)   Body mass index is 28.34 kg/m.   Physical Exam Vitals reviewed.  Constitutional:      Appearance: Normal appearance.  HENT:     Head: Normocephalic.     Right Ear: Tympanic membrane, ear canal and external ear normal.     Left Ear: Tympanic membrane, ear canal and external ear normal.     Mouth/Throat:     Mouth: Mucous membranes are moist.     Pharynx: Oropharynx is clear.  Eyes:     Extraocular Movements: Extraocular movements intact.     Pupils: Pupils are equal,  round, and reactive to light.  Cardiovascular:     Rate and Rhythm: Normal rate and regular rhythm.     Pulses: Normal pulses.     Heart sounds: Normal heart sounds.  Pulmonary:     Effort: Pulmonary effort is normal.     Breath sounds: Normal breath sounds.  Abdominal:     Palpations: Abdomen is soft.     Tenderness: There is no abdominal tenderness.  Musculoskeletal:     Cervical back: No tenderness.  Lymphadenopathy:     Cervical: No cervical adenopathy.  Skin:    General: Skin is warm and dry.  Neurological:     General: No focal deficit present.     Mental Status: He is alert and oriented to person, place, and time.  Psychiatric:        Mood and Affect: Mood normal.        Behavior: Behavior normal.       ASSESSMENT & PLAN: Problem List Items Addressed This Visit   None Visit Diagnoses       Routine general medical examination at a health care facility    -  Primary   Relevant Orders   Comprehensive metabolic panel with GFR   CBC with Differential/Platelet   Hemoglobin A1c   Lipid panel   PSA     Screening for prostate cancer       Relevant Orders   PSA     Screening for deficiency anemia       Relevant Orders   CBC with Differential/Platelet     Screening for lipoid disorders       Relevant Orders   Lipid panel     Screening for endocrine, metabolic and immunity disorder       Relevant Orders   Comprehensive metabolic panel with GFR   Hemoglobin A1c      Modifiable risk factors discussed with patient. Anticipatory guidance according to age provided. The following topics were also discussed: Social Determinants of Health Smoking.  Non-smoker Diet and nutrition Benefits of exercise Cancer family history reviewed Vaccinations review and recommendations Cardiovascular risk assessment and need for blood work Mental health including depression and anxiety Fall and accident prevention  Patient Instructions  Mantenimiento de radiographer, therapeutic en los hombres Health Maintenance, Male Adoptar un estilo de vida saludable y recibir atencin preventiva son importantes para promover la salud y counsellor. Consulte al mdico sobre: El esquema adecuado para hacerse pruebas y exmenes peridicos. Cosas que puede hacer por su cuenta para prevenir enfermedades y [place] sano. Qu debo saber sobre la dieta, el peso y el ejercicio? Consuma una dieta saludable  Consuma una dieta que incluya muchas verduras, frutas, productos lcteos con bajo contenido de grasa y protenas magras. No consuma muchos alimentos ricos en grasas slidas, azcares agregados o sodio. Mantenga un peso saludable El ndice de masa muscular [facility]) es una medida que puede utilizarse para identificar posibles  problemas de [place]. Proporciona una estimacin de la grasa corporal basndose en el peso y la altura. Su mdico puede ayudarle a determinar su [facility] y a personnel officer o pharmacologist un peso saludable. Haga ejercicio con regularidad Haga ejercicio con regularidad. Esta es una de las prcticas ms importantes que puede hacer por su salud. La [employer] de los adultos deben seguir estas pautas: Education Officer, Environmental, al menos, 150 minutos de actividad fsica por semana. El ejercicio debe aumentar la frecuencia cardaca y media planner transpirar (ejercicio de intensidad moderada). Hacer ejercicios de fortalecimiento por lo [facility] por  semana. Agregue esto a su plan de ejercicio de intensidad moderada. Pase menos tiempo sentado. Incluso la actividad fsica ligera puede ser beneficiosa. Controle sus niveles de colesterol y lpidos en la sangre Comience a realizarse anlisis de lpidos y oncologist en la sangre a los 20 aos y luego reptalos cada 5 aos. Es posible que insurance underwriter los niveles de colesterol con mayor frecuencia si: Sus niveles de lpidos y colesterol son altos. Es mayor de 40 aos. Presenta un alto riesgo de padecer enfermedades cardacas. Qu debo saber sobre las pruebas de deteccin del cncer? Muchos tipos de cncer pueden detectarse de manera temprana y, a menudo, pueden prevenirse. Segn su historia clnica y sus antecedentes familiares, es posible que deba realizarse pruebas de deteccin del cncer en diferentes edades. Esto puede incluir pruebas de deteccin de lo siguiente: Building services engineer. Cncer de prstata. Cncer de piel. Cncer de pulmn. Qu debo saber sobre la enfermedad cardaca, la diabetes y la hipertensin arterial? Presin arterial y enfermedad cardaca La hipertensin arterial causa enfermedades cardacas y aumenta el riesgo de accidente cerebrovascular. Es ms probable que esto se manifieste en las personas que tienen lecturas de presin arterial alta o tienen sobrepeso. Hable  con el mdico sobre sus valores de presin arterial deseados. Hgase controlar la presin arterial: Cada 3 a 5 aos si tiene entre 18 y 67 aos. Todos los aos si es mayor de 40 aos. Si tiene entre 65 y 21 aos y es fumador o insurance underwriter, pregntele al mdico si debe realizarse una prueba de deteccin de aneurisma artico abdominal (AAA) por nica vez. Diabetes Realcese exmenes de deteccin de la diabetes con regularidad. Este anlisis revisa el nivel de azcar en la sangre en [place]. Hgase las pruebas de deteccin: Cada tres aos despus de los 45 aos de edad si tiene un peso normal y un bajo riesgo de padecer diabetes. Con ms frecuencia y a partir de [place] edad inferior si tiene sobrepeso o un alto riesgo de padecer diabetes. Qu debo saber sobre la prevencin de infecciones? Hepatitis B Si tiene un riesgo ms alto de contraer hepatitis B, debe someterse a un examen de deteccin de este virus. Hable con el mdico para averiguar si tiene riesgo de contraer la infeccin por hepatitis B. Hepatitis C Se recomienda un anlisis de [place] para: Todos los que nacieron entre 1945 y [phone]. Todas las personas que tengan un riesgo de haber contrado hepatitis C. Enfermedades de transmisin sexual (ETS) Debe realizarse pruebas de deteccin de ITS todos los aos, incluidas la gonorrea y la clamidia, si: Es sexualmente activo y es menor de [address]. Es mayor de [address], y public affairs consultant informa que corre riesgo de tener este tipo de infecciones. La actividad sexual ha cambiado desde que le hicieron la ltima prueba de deteccin y tiene un riesgo mayor de tener clamidia o copy. Pregntele al mdico si usted tiene riesgo. Pregntele al mdico si usted tiene un alto riesgo de primary school teacher VIH. El mdico tambin puede recomendarle un medicamento recetado para ayudar a evitar la infeccin por el VIH. Si elige tomar medicamentos para prevenir el VIH, primero debe oneok de deteccin del VIH. Luego  debe hacerse anlisis cada 3 meses mientras est tomando los medicamentos. Siga estas indicaciones en su casa: Consumo de alcohol No beba alcohol si el mdico se lo prohbe. Si bebe alcohol: Limite la cantidad que consume de 0 a 2 bebidas por da. Sepa cunta cantidad de alcohol hay en las bebidas que  toma. En los [place], una medida equivale a una botella de cerveza de 12 oz (355 ml), un vaso de vino de 5 oz (148 ml) o un vaso de una bebida alcohlica de alta graduacin de 1 oz (44 ml). Estilo de vida No consuma ningn producto que contenga nicotina o tabaco. Estos productos incluyen cigarrillos, tabaco para theatre manager y aparatos de vapeo, como los cigarrillos electrnicos. Si necesita ayuda para dejar de consumir estos productos, consulte al mdico. No consuma drogas. No comparta agujas. Solicite ayuda a su mdico si necesita apoyo o informacin para abandonar las drogas. Indicaciones generales Realcese los estudios de rutina de [address], dentales y de wellsite geologist. Mantngase al da con las vacunas. Infrmele a su mdico si: Se siente deprimido con frecuencia. Alguna vez ha sido vctima de maltrato o no se siente seguro en su casa. Resumen Adoptar un estilo de vida saludable y recibir atencin preventiva son importantes para promover la salud y counsellor. Siga las instrucciones del mdico acerca de una dieta saludable, el ejercicio y la realizacin de pruebas o exmenes para hotel manager. Siga las instrucciones del mdico con respecto al control del colesterol y la presin arterial. Esta informacin no tiene theme park manager el consejo del mdico. Asegrese de hacerle al mdico cualquier pregunta que tenga. Document Revised: 04/19/2021 Document Reviewed: 04/19/2021 Elsevier Patient Education  2024 Elsevier Inc.    Emil Schaumann, MD [facility]

## 2024-09-22 NOTE — Patient Instructions (Signed)
 Mantenimiento de Research officer, political party, Male Adoptar un estilo de vida saludable y recibir atencin preventiva son importantes para promover la salud y Counsellor. Consulte al mdico sobre: El esquema adecuado para hacerse pruebas y exmenes peridicos. Cosas que puede hacer por su cuenta para prevenir enfermedades y [place] sano. Qu debo saber sobre la dieta, el peso y el ejercicio? Consuma una dieta saludable  Consuma una dieta que incluya muchas verduras, frutas, productos lcteos con bajo contenido de [place] y Associate Professor. No consuma muchos alimentos ricos en grasas slidas, azcares agregados o sodio. Mantenga un peso saludable El ndice de masa muscular [facility]) es una medida que puede utilizarse para identificar posibles problemas de [place]. Proporciona una estimacin de la grasa corporal basndose en el peso y la altura. Su mdico puede ayudarle a Engineer, site [facility] y a Personnel officer o Pharmacologist un peso saludable. Haga ejercicio con regularidad Haga ejercicio con regularidad. Esta es una de las prcticas ms importantes que puede hacer por su salud. La [employer] de los adultos deben seguir estas pautas: Education officer, environmental, al menos, 150 minutos de actividad fsica por semana. El ejercicio debe aumentar la frecuencia cardaca y Media planner transpirar (ejercicio de intensidad moderada). Hacer ejercicios de fortalecimiento por lo [facility] [employer] por semana. Agregue esto a su plan de ejercicio de intensidad moderada. Pase menos tiempo sentado. Incluso la actividad fsica ligera puede ser beneficiosa. Controle sus niveles de colesterol y lpidos en la sangre Comience a realizarse anlisis de lpidos y Oncologist en la sangre a los 20 aos y luego reptalos cada 5 aos. Es posible que Insurance underwriter los niveles de colesterol con mayor frecuencia si: Sus niveles de lpidos y colesterol son altos. Es mayor de 40 aos. Presenta un alto riesgo de padecer enfermedades cardacas. Qu debo  saber sobre las pruebas de deteccin del cncer? Muchos tipos de cncer pueden detectarse de manera temprana y, a menudo, pueden prevenirse. Segn su historia clnica y sus antecedentes familiares, es posible que deba realizarse pruebas de deteccin del cncer en diferentes edades. Esto puede incluir pruebas de deteccin de lo siguiente: Building services engineer. Cncer de prstata. Cncer de piel. Cncer de pulmn. Qu debo saber sobre la enfermedad cardaca, la diabetes y la hipertensin arterial? Presin arterial y enfermedad cardaca La hipertensin arterial causa enfermedades cardacas y [place] el riesgo de accidente cerebrovascular. Es ms probable que esto se manifieste en las personas que tienen lecturas de presin arterial alta o tienen sobrepeso. Hable con el mdico sobre sus valores de presin arterial deseados. Hgase controlar la presin arterial: Cada 3 a 5 aos si tiene entre 18 y 71 aos. Todos los aos si es mayor de 40 aos. Si tiene entre 65 y 26 aos y es fumador o Insurance underwriter, pregntele al mdico si debe realizarse una prueba de deteccin de aneurisma artico abdominal (AAA) por nica vez. Diabetes Realcese exmenes de deteccin de la diabetes con regularidad. Este anlisis revisa el nivel de azcar en la sangre en [place]. Hgase las pruebas de deteccin: Cada tres aos despus de los 45 aos de edad si tiene un peso normal y un bajo riesgo de padecer diabetes. Con ms frecuencia y a partir de [place] edad inferior si tiene sobrepeso o un alto riesgo de padecer diabetes. Qu debo saber sobre la prevencin de infecciones? Hepatitis B Si tiene un riesgo ms alto de contraer hepatitis B, debe someterse a un examen de deteccin de este virus. Hable con el mdico para averiguar si tiene riesgo de  contraer la infeccin por hepatitis B. Hepatitis C Se recomienda un anlisis de Benjamin Perez para: Todos los que nacieron entre 1946 y [phone]. Todas las personas que tengan un riesgo de haber  contrado hepatitis C. Enfermedades de transmisin sexual (ETS) Debe realizarse pruebas de deteccin de ITS todos los aos, incluidas la gonorrea y la clamidia, si: Es sexualmente activo y es menor de [address]. Es mayor de [address], y Public affairs consultant informa que corre riesgo de tener este tipo de infecciones. La actividad sexual ha cambiado desde que le hicieron la ltima prueba de deteccin y tiene un riesgo mayor de Warehouse manager clamidia o Copy. Pregntele al mdico si usted tiene riesgo. Pregntele al mdico si usted tiene un alto riesgo de Primary school teacher VIH. El mdico tambin puede recomendarle un medicamento recetado para ayudar a evitar la infeccin por el VIH. Si elige tomar medicamentos para prevenir el VIH, primero debe [employer] de deteccin del VIH. Luego debe hacerse anlisis cada 3 meses mientras est tomando los medicamentos. Siga estas indicaciones en su casa: Consumo de alcohol No beba alcohol si el mdico se lo prohbe. Si bebe alcohol: Limite la cantidad que consume de 0 a 2 bebidas por da. Sepa cunta cantidad de alcohol hay en las bebidas que toma. En los [address], una medida equivale a una botella de cerveza de 12 oz (355 ml), un vaso de vino de 5 oz (148 ml) o un vaso de una bebida alcohlica de alta graduacin de 1 oz (44 ml). Estilo de vida No consuma ningn producto que contenga nicotina o tabaco. Estos productos incluyen cigarrillos, tabaco para Theatre manager y aparatos de vapeo, como los Administrator, Civil Service. Si necesita ayuda para dejar de consumir estos productos, consulte al mdico. No consuma drogas. No comparta agujas. Solicite ayuda a su mdico si necesita apoyo o informacin para abandonar las drogas. Indicaciones generales Realcese los estudios de rutina de [address], dentales y de Wellsite geologist. Mantngase al da con las vacunas. Infrmele a su mdico si: Se siente deprimido con frecuencia. Alguna vez ha sido vctima de [place] o no se siente seguro en su  casa. Resumen Adoptar un estilo de vida saludable y recibir atencin preventiva son importantes para promover la salud y Counsellor. Siga las instrucciones del mdico acerca de una dieta saludable, el ejercicio y la realizacin de pruebas o exmenes para Hotel manager. Siga las instrucciones del mdico con respecto al control del colesterol y la presin arterial. Esta informacin no tiene Theme park manager el consejo del mdico. Asegrese de hacerle al mdico cualquier pregunta que tenga. Document Revised: 04/19/2021 Document Reviewed: 04/19/2021 Elsevier Patient Education  2024 [employer].

## 2024-10-16 ENCOUNTER — Telehealth: Payer: Self-pay

## 2024-10-16 NOTE — Telephone Encounter (Signed)
 Copied from CRM [id number]. Topic: Clinical - Lab/Test Results >> Oct 16, 2024 10:03 AM Eleanor C wrote: Reason for CRM: patient's wife called regarding recent visit test results. She stated they never heard back regarding what his test results were. I inquired to see if he had access to Mychart and she said he wasn't able to get it set up. I sent a link to get it set up and gave them his username but stated in the meantime I would sent a message to see if the physician could call with results of previous tests (the testing that I could see in the lab section were in Spanish and I did not know what I was stating to patient's wife) and convey results of previous testing.

## 2024-10-16 NOTE — Telephone Encounter (Signed)
 Results were seen as of records today
